# Patient Record
Sex: Female | Born: 1990 | Race: Black or African American | Hispanic: No | Marital: Single | Smoking: Never smoker
Health system: Southern US, Community
[De-identification: ages and names within clinical notes are randomized; demographics above are authoritative.]

## PROBLEM LIST (undated history)

## (undated) ENCOUNTER — Inpatient Hospital Stay (HOSPITAL_COMMUNITY): Payer: Self-pay

## (undated) DIAGNOSIS — J189 Pneumonia, unspecified organism: Secondary | ICD-10-CM

## (undated) DIAGNOSIS — K219 Gastro-esophageal reflux disease without esophagitis: Secondary | ICD-10-CM

## (undated) DIAGNOSIS — O24419 Gestational diabetes mellitus in pregnancy, unspecified control: Secondary | ICD-10-CM

## (undated) HISTORY — PX: NO PAST SURGERIES: SHX2092

---

## 2013-09-18 ENCOUNTER — Encounter (HOSPITAL_COMMUNITY): Payer: Self-pay | Admitting: Emergency Medicine

## 2013-09-18 ENCOUNTER — Emergency Department (HOSPITAL_COMMUNITY)
Admission: EM | Admit: 2013-09-18 | Discharge: 2013-09-18 | Disposition: A | Discharge status: Home / Self Care | Attending: Emergency Medicine | Admitting: Emergency Medicine

## 2013-09-18 DIAGNOSIS — J029 Acute pharyngitis, unspecified: Secondary | ICD-10-CM

## 2013-09-18 LAB — RAPID STREP SCREEN (MED CTR MEBANE ONLY): Streptococcus, Group A Screen (Direct): NEGATIVE

## 2013-09-18 MED ORDER — GUAIFENESIN-CODEINE 100-10 MG/5ML PO SOLN: 5.0000 mL | Freq: Three times a day (TID) | ORAL | Status: DC | PRN

## 2013-09-18 NOTE — ED Notes (Signed)
Pt reported having "upper respiratory tract" symptoms and drainage x 5 days, and now a sore throat starting 2 days ago.

## 2013-09-18 NOTE — ED Notes (Signed)
Pt given d/c instructions and verbalized understanding. NAD at this time.  

## 2013-09-18 NOTE — ED Provider Notes (Signed)
CSN: [id number]     Arrival date & time 09/18/13  0531 History   First MD Initiated Contact with Patient 09/18/13 0544     Chief Complaint  Patient presents with  . Sore Throat   (Consider location/radiation/quality/duration/timing/severity/associated sxs/prior Treatment) Patient is a 22 y.o. female presenting with pharyngitis. The history is provided by the patient.  Sore Throat This is a new problem. Episode onset: 5 days ago. The problem occurs constantly. The problem has been gradually worsening. Pertinent negatives include no chest pain and no shortness of breath. The symptoms are aggravated by swallowing. Nothing relieves the symptoms. She has tried nothing for the symptoms. The treatment provided no relief.    History reviewed. No pertinent past medical history. History reviewed. No pertinent past surgical history. No family history on file. History  Substance Use Topics  . Smoking status: Never Smoker   . Smokeless tobacco: Not on file  . Alcohol Use: Yes     Comment: Occasionaly    OB History   Grav Para Term Preterm Abortions TAB SAB Ect Mult Living                 Review of Systems  Respiratory: Negative for shortness of breath.   Cardiovascular: Negative for chest pain.  All other systems reviewed and are negative.    Allergies  Review of patient's allergies indicates not on file.  Home Medications  No current outpatient prescriptions on file. BP 121/80  Pulse 82  Temp(Src) 97.4 F (36.3 C) (Oral)  Resp 14  SpO2 100%  LMP 01/16/2013 Physical Exam  Nursing note and vitals reviewed. Constitutional: She is oriented to person, place, and time. She appears well-developed and well-nourished. No distress.  HENT:  Head: Normocephalic and atraumatic.  The posterior oropharynx is mildly erythematous with no exudates.  Neck: Normal range of motion. Neck supple.  Cardiovascular: Normal rate and regular rhythm.  Exam reveals no gallop and no friction rub.   No  murmur heard. Pulmonary/Chest: Effort normal and breath sounds normal. No respiratory distress. She has no wheezes.  Abdominal: Soft. Bowel sounds are normal. She exhibits no distension. There is no tenderness.  Musculoskeletal: Normal range of motion.  Lymphadenopathy:    She has no cervical adenopathy.  Neurological: She is alert and oriented to person, place, and time.  Skin: Skin is warm and dry. She is not diaphoretic.    ED Course  Procedures (including critical care time) Labs Review Labs Reviewed  RAPID STREP SCREEN   Imaging Review No results found.  EKG Interpretation   None       MDM  No diagnosis found. Strep test is negative. Symptoms are most likely viral in nature. She will be discharged home with instructions to take ibuprofen as needed and return to emergency department if her symptoms worsen.    Geoffery Lyons, MD 09/18/13 [phone]

## 2013-09-20 LAB — CULTURE, GROUP A STREP

## 2015-07-28 ENCOUNTER — Encounter (HOSPITAL_COMMUNITY): Payer: Self-pay | Admitting: Emergency Medicine

## 2015-07-28 ENCOUNTER — Emergency Department (HOSPITAL_COMMUNITY)
Admission: EM | Admit: 2015-07-28 | Discharge: 2015-07-28 | Disposition: A | Discharge status: Home / Self Care | Payer: 59 | Source: Home / Self Care | Attending: Family Medicine | Admitting: Family Medicine

## 2015-07-28 DIAGNOSIS — R0982 Postnasal drip: Secondary | ICD-10-CM

## 2015-07-28 DIAGNOSIS — J9801 Acute bronchospasm: Secondary | ICD-10-CM

## 2015-07-28 DIAGNOSIS — J189 Pneumonia, unspecified organism: Secondary | ICD-10-CM | POA: Diagnosis not present

## 2015-07-28 MED ORDER — AZITHROMYCIN 250 MG PO TABS: 250.0000 mg | ORAL_TABLET | Freq: Every day | ORAL | Status: DC

## 2015-07-28 MED ORDER — ALBUTEROL SULFATE 108 (90 BASE) MCG/ACT IN AEPB: 1.0000 | INHALATION_SPRAY | RESPIRATORY_TRACT | Status: DC | PRN

## 2015-07-28 MED ORDER — SPACER/AERO-HOLDING CHAMBERS DEVI: Status: DC

## 2015-07-28 MED ORDER — IPRATROPIUM BROMIDE 0.06 % NA SOLN: 2.0000 | Freq: Four times a day (QID) | NASAL | Status: DC

## 2015-07-28 MED ORDER — FLUCONAZOLE 150 MG PO TABS: 150.0000 mg | ORAL_TABLET | Freq: Every day | ORAL | Status: DC

## 2015-07-28 NOTE — ED Provider Notes (Signed)
CSN: [id number]     Arrival date & time 07/28/15  1301 History   First MD Initiated Contact with Patient 07/28/15 1317     Chief Complaint  Patient presents with  . Cough   (Consider location/radiation/quality/duration/timing/severity/associated sxs/prior Treatment) HPI   Cough. Ongoing for 2 mo.  Started after starting at [facility] Initially dry cough.  Comes and goes.  Worse at night Became productive 4 days ago.  Denies fevers, nausea, SOB, palpitations, wheezing.  Coughing episodes typically last several minutes.  Mucinex w/o relief    History reviewed. No pertinent past medical history. History reviewed. No pertinent past surgical history. Family History  Problem Relation Age of Onset  . Hypertension Mother   . Hypertension Father   . Diabetes Brother     type 1   Social History  Substance Use Topics  . Smoking status: Never Smoker   . Smokeless tobacco: None  . Alcohol Use: Yes     Comment: Occasionaly    OB History    No data available     Review of Systems Per HPI with all other pertinent systems negative.   Allergies  Review of patient's allergies indicates no known allergies.  Home Medications   Prior to Admission medications   Medication Sig Start Date End Date Taking? Authorizing Provider  Albuterol Sulfate (PROAIR RESPICLICK) 108 (90 BASE) MCG/ACT AEPB Inhale 1 Inhaler into the lungs every 4 (four) hours as needed (shortness of breath or wheezing). 07/28/15   Ozella Rocks, MD  azithromycin (ZITHROMAX) 250 MG tablet Take 1 tablet (250 mg total) by mouth daily. Take first 2 tablets together, then 1 every day until finished. 07/28/15   Ozella Rocks, MD  DM-Doxylamine-Acetaminophen (NYQUIL COLD & FLU PO) Take 1 capsule by mouth at bedtime.    Historical Provider, MD  fluconazole (DIFLUCAN) 150 MG tablet Take 1 tablet (150 mg total) by mouth daily. Repeat dose in 3 days 07/28/15   Ozella Rocks, MD  guaiFENesin-codeine 100-10 MG/5ML syrup Take 5 mLs by mouth  3 (three) times daily as needed for cough. 09/18/13   Geoffery Lyons, MD  ipratropium (ATROVENT) 0.06 % nasal spray Place 2 sprays into both nostrils 4 (four) times daily. 07/28/15   Ozella Rocks, MD  Pseudoephedrine-APAP-DM (DAYQUIL MULTI-SYMPTOM PO) Take 1 capsule by mouth daily.    Historical Provider, MD  Spacer/Aero-Holding Chambers DEVI Use as directed 07/28/15   Ozella Rocks, MD   Meds Ordered and Administered this Visit  Medications - No data to display  BP 109/68 mmHg  Pulse 73  Temp(Src) 98.7 F (37.1 C) (Oral)  Resp 16  SpO2 100%  LMP 07/12/2015 No data found.   Physical Exam Physical Exam  Constitutional: oriented to person, place, and time. appears well-developed and well-nourished. No distress.  HENT:  Head: Normocephalic and atraumatic.  Eyes: EOMI. PERRL.  Nasal turbinates boggy bilaterally  Neck: Normal range of motion.  Cardiovascular: RRR, no m/r/g, 2+ distal pulses,  Pulmonary/Chest: Effort, left lung and inspiratory wheezing appreciated, no crackles or rhonchi. Abdominal: Soft. Bowel sounds are normal. NonTTP, no distension.  Musculoskeletal: Normal range of motion. Non ttp, no effusion.  Neurological: alert and oriented to person, place, and time.  Skin: Skin is warm. No rash noted. non diaphoretic.  Psychiatric: normal mood and affect. behavior is normal. Judgment and thought content normal.   ED Course  Procedures (including critical care time)  Labs Review Labs Reviewed - No data to display  Imaging Review No  results found.   Visual Acuity Review  Right Eye Distance:   Left Eye Distance:   Bilateral Distance:    Right Eye Near:   Left Eye Near:    Bilateral Near:         MDM   1. Walking pneumonia   2. Bronchospasm   3. Post-nasal drip    Continue Mucinex, start Zyrtec or Allegra at night, nasal Atrovent, and albuterol. Not improving Z-Pak. Use Diflucan if the yeast infection. The need for steroids.    Ozella Rocks,  MD 07/28/15 [phone]

## 2015-07-28 NOTE — Discharge Instructions (Signed)
Your symptoms are likely due to accommodation of postnasal drip causing lung irritation and bronchospasm, possibly a mild case of walking pneumonia. Please use the albuterol every 4 hours for the next 24 hours and every 4 hours as needed for coughing or wheezing. Please start using the nasal Atrovent help dry up any nasal secretions and taken nightly allergy pill such as Zyrtec or Claritin or Allegra. Continue using Mucinex at his full dose. Please start a Z-Pak if you are not improving. Please use the Diflucan if you develop a yeast infection.

## 2015-07-28 NOTE — ED Notes (Signed)
C/o cough States cough is productive States she has seen mucous in her vaginal area and stool Took mucinex as tx

## 2015-09-21 ENCOUNTER — Other Ambulatory Visit (HOSPITAL_COMMUNITY)
Admission: RE | Admit: 2015-09-21 | Discharge: 2015-09-21 | Disposition: A | Discharge status: Home / Self Care | Payer: 59 | Source: Ambulatory Visit | Attending: Family Medicine | Admitting: Family Medicine

## 2015-09-21 ENCOUNTER — Encounter (HOSPITAL_COMMUNITY): Payer: Self-pay | Admitting: Emergency Medicine

## 2015-09-21 ENCOUNTER — Emergency Department (HOSPITAL_COMMUNITY)
Admission: EM | Admit: 2015-09-21 | Discharge: 2015-09-21 | Disposition: A | Discharge status: Home / Self Care | Payer: 59 | Source: Home / Self Care

## 2015-09-21 DIAGNOSIS — N760 Acute vaginitis: Secondary | ICD-10-CM | POA: Diagnosis present

## 2015-09-21 DIAGNOSIS — N898 Other specified noninflammatory disorders of vagina: Secondary | ICD-10-CM | POA: Diagnosis not present

## 2015-09-21 DIAGNOSIS — Z113 Encounter for screening for infections with a predominantly sexual mode of transmission: Secondary | ICD-10-CM | POA: Insufficient documentation

## 2015-09-21 LAB — POCT URINALYSIS DIP (DEVICE)
BILIRUBIN URINE: NEGATIVE
Glucose, UA: NEGATIVE mg/dL
HGB URINE DIPSTICK: NEGATIVE
Ketones, ur: NEGATIVE mg/dL
Nitrite: NEGATIVE
PH: 6.5 (ref 5.0–8.0)
Protein, ur: NEGATIVE mg/dL
SPECIFIC GRAVITY, URINE: 1.025 (ref 1.005–1.030)
Urobilinogen, UA: 0.2 mg/dL (ref 0.0–1.0)

## 2015-09-21 LAB — POCT PREGNANCY, URINE: Preg Test, Ur: NEGATIVE

## 2015-09-21 MED ORDER — CEFTRIAXONE SODIUM 250 MG IJ SOLR
250.0000 mg | Freq: Once | INTRAMUSCULAR | Status: AC
Start: 2015-09-21 — End: 2015-09-21
  Administered 2015-09-21: 250 mg via INTRAMUSCULAR

## 2015-09-21 MED ORDER — AZITHROMYCIN 250 MG PO TABS
ORAL_TABLET | ORAL | Status: AC
  Filled 2015-09-21: qty 4

## 2015-09-21 MED ORDER — AZITHROMYCIN 250 MG PO TABS
1000.0000 mg | ORAL_TABLET | Freq: Once | ORAL | Status: AC
  Administered 2015-09-21: 1000 mg via ORAL

## 2015-09-21 MED ORDER — CEFTRIAXONE SODIUM 250 MG IJ SOLR
INTRAMUSCULAR | Status: AC
  Filled 2015-09-21: qty 250

## 2015-09-21 NOTE — ED Provider Notes (Addendum)
CSN: [id number]     Arrival date & time 09/21/15  1455 History   None    Chief Complaint  Patient presents with  . Vaginal Discharge   (Consider location/radiation/quality/duration/timing/severity/associated sxs/prior Treatment) HPI History obtained from patient:   LOCATION:vagina SEVERITY:0 DURATION:several days CONTEXT: noted thick yellow vaginal discharge over the last few days. Similar symptoms in September was reassured by PCP that all was normal. Afraid something else may be going on. No intercourse since September episode.  QUALITY: similar to previous episode in september MODIFYING FACTORS:using panty liners ASSOCIATED SYMPTOMS: none TIMING: mostly when she wipes her vaginal. Clear drainage noted on panty liner.   History reviewed. No pertinent past medical history. History reviewed. No pertinent past surgical history. Family History  Problem Relation Age of Onset  . Hypertension Mother   . Hypertension Father   . Diabetes Brother     type 1   Social History  Substance Use Topics  . Smoking status: Never Smoker   . Smokeless tobacco: None  . Alcohol Use: Yes     Comment: Occasionaly    OB History    No data available     Review of Systems ROS +'ve vaginal discharge  Denies: HEADACHE, NAUSEA, ABDOMINAL PAIN, CHEST PAIN, CONGESTION, DYSURIA, SHORTNESS OF BREATH, NO CHANGE IN SEXUAL PARTNER  Allergies  Review of patient's allergies indicates no known allergies.  Home Medications   Prior to Admission medications   Medication Sig Start Date End Date Taking? Authorizing Provider  Albuterol Sulfate (PROAIR RESPICLICK) 108 (90 BASE) MCG/ACT AEPB Inhale 1 Inhaler into the lungs every 4 (four) hours as needed (shortness of breath or wheezing). 07/28/15   Ozella Rocksavid J Merrell, MD  azithromycin (ZITHROMAX) 250 MG tablet Take 1 tablet (250 mg total) by mouth daily. Take first 2 tablets together, then 1 every day until finished. 07/28/15   Ozella Rocksavid J Merrell, MD    DM-Doxylamine-Acetaminophen (NYQUIL COLD & FLU PO) Take 1 capsule by mouth at bedtime.    Historical Provider, MD  fluconazole (DIFLUCAN) 150 MG tablet Take 1 tablet (150 mg total) by mouth daily. Repeat dose in 3 days 07/28/15   Ozella Rocksavid J Merrell, MD  guaiFENesin-codeine 100-10 MG/5ML syrup Take 5 mLs by mouth 3 (three) times daily as needed for cough. 09/18/13   Geoffery Lyonsouglas Delo, MD  ipratropium (ATROVENT) 0.06 % nasal spray Place 2 sprays into both nostrils 4 (four) times daily. 07/28/15   Ozella Rocksavid J Merrell, MD  Pseudoephedrine-APAP-DM (DAYQUIL MULTI-SYMPTOM PO) Take 1 capsule by mouth daily.    Historical Provider, MD  Spacer/Aero-Holding Chambers DEVI Use as directed 07/28/15   Ozella Rocksavid J Merrell, MD   Meds Ordered and Administered this Visit  Medications - No data to display  BP 111/81 mmHg  Pulse 81  Temp(Src) 98.3 F (36.8 C) (Oral)  Resp 18  SpO2 98%  LMP 08/29/2015 No data found.   Physical Exam  Constitutional: She is oriented to person, place, and time. She appears well-developed and well-nourished. No distress.  HENT:  Head: Normocephalic and atraumatic.  Eyes: Conjunctivae are normal.  Pulmonary/Chest: Effort normal and breath sounds normal.  Abdominal: Soft. She exhibits no distension. There is no tenderness. There is no rebound.  Genitourinary: Cervix exhibits discharge. Cervix exhibits no motion tenderness. Right adnexum displays no tenderness. Left adnexum displays no tenderness. Vaginal discharge found.    Musculoskeletal: Normal range of motion.  Neurological: She is alert and oriented to person, place, and time.  Skin: Skin is warm and dry.  Psychiatric:  She has a normal mood and affect. Her behavior is normal. Judgment and thought content normal.    ED Course  Procedures (including critical care time)  Labs Review Labs Reviewed  POCT URINALYSIS DIP (DEVICE) - Abnormal; Notable for the following:    Leukocytes, UA TRACE (*)    All other components within normal limits   POCT PREGNANCY, URINE  CERVICOVAGINAL ANCILLARY ONLY    Imaging Review No results found.   Visual Acuity Review  Right Eye Distance:   Left Eye Distance:   Bilateral Distance:    Right Eye Near:   Left Eye Near:    Bilateral Near:         MDM   1. Vaginal discharge    Examination is non specific at this time, no CMT.  As discussed with patient will cover her with ceftriaxone and azithromycin. Cultures should return in 2 days. If positive will call and discuss.  Follow up with her PCP.    Tharon Aquas, PA 09/22/15 1305  Chart is entered to review and document lab results. Attempted to contact patient, voice mail left. No contact with patient, and no change in management at this time.    Tharon Aquas, PA 09/24/15 1310

## 2015-09-21 NOTE — Discharge Instructions (Signed)

## 2015-09-21 NOTE — ED Notes (Signed)
[facility] possible [facility] infection. The patient stated that she is having a discharge and that the symptoms have been off and on since September. She did state that she has seen her PCP for the same.

## 2015-09-23 LAB — CERVICOVAGINAL ANCILLARY ONLY
CHLAMYDIA, DNA PROBE: NEGATIVE
NEISSERIA GONORRHEA: NEGATIVE
Wet Prep (BD Affirm): POSITIVE — AB

## 2015-09-23 NOTE — ED Notes (Signed)
Final report of vaginal swabs negative for GC, chlamydia, positive for trichomonas, not treated. Discussed w Dr Griffin BasilJD Kindl, who authorized Flagyl 500 mg, PO, BID x 7 days, QS, NR. Called patient , and after verifying ID, discussed positive findings. Advised to refrain from unprotected sex w her partner until both parties have completed treatment.  Called and left detailed VM on answering machine at [facility], [address] at patient request

## 2016-03-25 ENCOUNTER — Encounter (HOSPITAL_COMMUNITY): Payer: Self-pay | Admitting: Emergency Medicine

## 2016-03-25 ENCOUNTER — Ambulatory Visit (HOSPITAL_COMMUNITY)
Admission: EM | Admit: 2016-03-25 | Discharge: 2016-03-25 | Disposition: A | Discharge status: Home / Self Care | Payer: 59 | Attending: Family Medicine | Admitting: Family Medicine

## 2016-03-25 DIAGNOSIS — J069 Acute upper respiratory infection, unspecified: Secondary | ICD-10-CM | POA: Diagnosis present

## 2016-03-25 DIAGNOSIS — J302 Other seasonal allergic rhinitis: Secondary | ICD-10-CM | POA: Diagnosis not present

## 2016-03-25 DIAGNOSIS — Z833 Family history of diabetes mellitus: Secondary | ICD-10-CM | POA: Insufficient documentation

## 2016-03-25 DIAGNOSIS — Z8249 Family history of ischemic heart disease and other diseases of the circulatory system: Secondary | ICD-10-CM | POA: Insufficient documentation

## 2016-03-25 LAB — POCT RAPID STREP A: STREPTOCOCCUS, GROUP A SCREEN (DIRECT): NEGATIVE

## 2016-03-25 NOTE — ED Provider Notes (Signed)
CSN: [id number]     Arrival date & time 03/25/16  1508 History   First MD Initiated Contact with Patient 03/25/16 1654     Chief Complaint  Patient presents with  . URI   (Consider location/radiation/quality/duration/timing/severity/associated sxs/prior Treatment) HPI Comments: 25 year old female states that last week she began having what she thought to be cold symptoms. She has had some chills, low-grade fever of 100.4, sore throat. She took some TheraFlu and most of her symptoms abated and she felt better with the exception of sore throat and ears popping. She states that the PND is also persistent.   History reviewed. No pertinent past medical history. History reviewed. No pertinent past surgical history. Family History  Problem Relation Age of Onset  . Hypertension Mother   . Hypertension Father   . Diabetes Brother     type 1   Social History  Substance Use Topics  . Smoking status: Never Smoker   . Smokeless tobacco: None  . Alcohol Use: Yes     Comment: Occasionaly    OB History    No data available     Review of Systems  Constitutional: Positive for fever and activity change. Negative for chills, appetite change and fatigue.  HENT: Positive for congestion, postnasal drip, rhinorrhea and sore throat. Negative for facial swelling.   Eyes: Negative.   Respiratory: Negative.   Cardiovascular: Negative.   Gastrointestinal: Negative.   Musculoskeletal: Negative for neck pain and neck stiffness.  Skin: Negative for pallor and rash.  Neurological: Negative.     Allergies  Review of patient's allergies indicates no known allergies.  Home Medications   Prior to Admission medications   Medication Sig Start Date End Date Taking? Authorizing Provider  Albuterol Sulfate (PROAIR RESPICLICK) 108 (90 BASE) MCG/ACT AEPB Inhale 1 Inhaler into the lungs every 4 (four) hours as needed (shortness of breath or wheezing). 07/28/15   Ozella Rocks, MD  azithromycin (ZITHROMAX) 250  MG tablet Take 1 tablet (250 mg total) by mouth daily. Take first 2 tablets together, then 1 every day until finished. 07/28/15   Ozella Rocks, MD  DM-Doxylamine-Acetaminophen (NYQUIL COLD & FLU PO) Take 1 capsule by mouth at bedtime.    Historical Provider, MD  fluconazole (DIFLUCAN) 150 MG tablet Take 1 tablet (150 mg total) by mouth daily. Repeat dose in 3 days 07/28/15   Ozella Rocks, MD  guaiFENesin-codeine 100-10 MG/5ML syrup Take 5 mLs by mouth 3 (three) times daily as needed for cough. 09/18/13   Geoffery Lyons, MD  ipratropium (ATROVENT) 0.06 % nasal spray Place 2 sprays into both nostrils 4 (four) times daily. 07/28/15   Ozella Rocks, MD  Pseudoephedrine-APAP-DM (DAYQUIL MULTI-SYMPTOM PO) Take 1 capsule by mouth daily.    Historical Provider, MD  Spacer/Aero-Holding Chambers DEVI Use as directed 07/28/15   Ozella Rocks, MD   Meds Ordered and Administered this Visit  Medications - No data to display  BP 131/76 mmHg  Pulse 86  Temp(Src) 99.5 F (37.5 C) (Oral)  Resp 12  SpO2 100%  LMP 03/16/2016 No data found.   Physical Exam  Constitutional: She is oriented to person, place, and time. She appears well-developed and well-nourished. No distress.  HENT:  Mouth/Throat: No oropharyngeal exudate.  Bilateral TMs are retracted. No erythema or effusion. Oropharynx with palatine tonsils. No exudates or swelling. Minimal. Light erythema and positive clear PND.  Eyes: Conjunctivae and EOM are normal.  Neck: Normal range of motion. Neck supple.  Cardiovascular:  Normal rate and regular rhythm.   Pulmonary/Chest: Effort normal and breath sounds normal. No respiratory distress. She has no wheezes.  Musculoskeletal: Normal range of motion.  Lymphadenopathy:    She has no cervical adenopathy.  Neurological: She is alert and oriented to person, place, and time. She exhibits normal muscle tone.  Skin: Skin is warm and dry.  Nursing note and vitals reviewed.   ED Course  Procedures  (including critical care time)  Labs Review Labs Reviewed  POCT RAPID STREP A   Results for orders placed or performed during the hospital encounter of 03/25/16  POCT rapid strep A [facility]  Result Value Ref Range   Streptococcus, Group A Screen (Direct) NEGATIVE NEGATIVE     Imaging Review No results found.   Visual Acuity Review  Right Eye Distance:   Left Eye Distance:   Bilateral Distance:    Right Eye Near:   Left Eye Near:    Bilateral Near:         MDM   1. Other seasonal allergic rhinitis    For nasal and head congestion may take Sudafed PE 10 mg every 4 hours as needed. Saline nasal spray used frequently. For drainage may use Allegra, Claritin or Zyrtec. If you need stronger medicine to stop drainage may take Chlor-Trimeton 2-4 mg every 4 hours. This may cause drowsiness. Ibuprofen 600 mg every 6 hours as needed for pain, discomfort or fever. Drink plenty of fluids and stay well-hydrated.     Hayden Rasmussenavid Rodert Hinch, NP 03/25/16 1742  Hayden Rasmussenavid Nur Rabold, NP 03/25/16 1743  Hayden Rasmussenavid Tayquan Gassman, NP 03/25/16 2029

## 2016-03-25 NOTE — ED Notes (Signed)
C/o cold sx onset 05/03 associated w/ST, fevers, chills, congestion, odynophagia and feeling weak.  A&O x4... No acute distress.

## 2016-03-25 NOTE — Discharge Instructions (Signed)
Allergic Rhinitis For nasal and head congestion may take Sudafed PE 10 mg every 4 hours as needed. Saline nasal spray used frequently. For drainage may use Allegra, Claritin or Zyrtec. If you need stronger medicine to stop drainage may take Chlor-Trimeton 2-4 mg every 4 hours. This may cause drowsiness. Ibuprofen 600 mg every 6 hours as needed for pain, discomfort or fever. Drink plenty of fluids and stay well-hydrated. Cepacol lozenges for throat pain  Allergic rhinitis is when the mucous membranes in the nose respond to allergens. Allergens are particles in the air that cause your body to have an allergic reaction. This causes you to release allergic antibodies. Through a chain of events, these eventually cause you to release histamine into the blood stream. Although meant to protect the body, it is this release of histamine that causes your discomfort, such as frequent sneezing, congestion, and an itchy, runny nose.  CAUSES Seasonal allergic rhinitis (hay fever) is caused by pollen allergens that may come from grasses, trees, and weeds. Year-round allergic rhinitis (perennial allergic rhinitis) is caused by allergens such as house dust mites, pet dander, and mold spores. SYMPTOMS  Nasal stuffiness (congestion).  Itchy, runny nose with sneezing and tearing of the eyes. DIAGNOSIS Your health care provider can help you determine the allergen or allergens that trigger your symptoms. If you and your health care provider are unable to determine the allergen, skin or blood testing may be used. Your health care provider will diagnose your condition after taking your health history and performing a physical exam. Your health care provider may assess you for other related conditions, such as asthma, pink eye, or an ear infection. TREATMENT Allergic rhinitis does not have a cure, but it can be controlled by:  Medicines that block allergy symptoms. These may include allergy shots, nasal sprays, and oral  antihistamines.  Avoiding the allergen. Hay fever may often be treated with antihistamines in pill or nasal spray forms. Antihistamines block the effects of histamine. There are over-the-counter medicines that may help with nasal congestion and swelling around the eyes. Check with your health care provider before taking or giving this medicine. If avoiding the allergen or the medicine prescribed do not work, there are many new medicines your health care provider can prescribe. Stronger medicine may be used if initial measures are ineffective. Desensitizing injections can be used if medicine and avoidance does not work. Desensitization is when a patient is given ongoing shots until the body becomes less sensitive to the allergen. Make sure you follow up with your health care provider if problems continue. HOME CARE INSTRUCTIONS It is not possible to completely avoid allergens, but you can reduce your symptoms by taking steps to limit your exposure to them. It helps to know exactly what you are allergic to so that you can avoid your specific triggers. SEEK MEDICAL CARE IF:  You have a fever.  You develop a cough that does not stop easily (persistent).  You have shortness of breath.  You start wheezing.  Symptoms interfere with normal daily activities.   This information is not intended to replace advice given to you by your health care provider. Make sure you discuss any questions you have with your health care provider.   Document Released: 07/31/2001 Document Revised: 11/26/2014 Document Reviewed: 07/13/2013 Elsevier Interactive Patient Education [employer]2016 [employer] Inc[employer]

## 2016-03-27 ENCOUNTER — Ambulatory Visit (HOSPITAL_COMMUNITY)
Admission: EM | Admit: 2016-03-27 | Discharge: 2016-03-27 | Disposition: A | Discharge status: Home / Self Care | Payer: 59 | Attending: Family Medicine | Admitting: Family Medicine

## 2016-03-27 ENCOUNTER — Encounter (HOSPITAL_COMMUNITY): Payer: Self-pay | Admitting: Emergency Medicine

## 2016-03-27 DIAGNOSIS — Z8249 Family history of ischemic heart disease and other diseases of the circulatory system: Secondary | ICD-10-CM | POA: Diagnosis not present

## 2016-03-27 DIAGNOSIS — R5383 Other fatigue: Secondary | ICD-10-CM | POA: Diagnosis not present

## 2016-03-27 DIAGNOSIS — R5381 Other malaise: Secondary | ICD-10-CM | POA: Insufficient documentation

## 2016-03-27 DIAGNOSIS — Z833 Family history of diabetes mellitus: Secondary | ICD-10-CM | POA: Insufficient documentation

## 2016-03-27 DIAGNOSIS — J029 Acute pharyngitis, unspecified: Secondary | ICD-10-CM | POA: Insufficient documentation

## 2016-03-27 LAB — POCT RAPID STREP A: Streptococcus, Group A Screen (Direct): NEGATIVE

## 2016-03-27 LAB — POCT INFECTIOUS MONO SCREEN: Mono Screen: NEGATIVE

## 2016-03-27 NOTE — Discharge Instructions (Signed)
It is a pleasure to see you today.  The rapid strep and the test for mononucleosis are both negative.   I recommend continued monitoring and symptomatic treatment with Tylenol and ibuprofen as needed; I recommend establishing with a primary care physician for further evaluation and longitudinal follow up.  I am giving you the contact information for a primary care practice nearby.

## 2016-03-27 NOTE — ED Provider Notes (Addendum)
CSN: [id number]     Arrival date & time 03/27/16  1445 History   First MD Initiated Contact with Patient 03/27/16 1624     No chief complaint on file.  (Consider location/radiation/quality/duration/timing/severity/associated sxs/prior Treatment) The history is provided by the patient. No language interpreter was used.  Patient presents with complaint of pharyngitis and malaise; [facility] and had negative rapid strep done then.  Since then she has continued to feel ill.  Her illness began on May 1st with sore throat. Had temp around 100-101F on Weds, May 3rd and has had decreased appetite since. 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   Social Hx; Nonsmoker.  RN.    Family Hx; Brother has had mononucleosis, similar sxs to patient's presenting complaints.   History reviewed. No pertinent past medical history. History reviewed. No pertinent past surgical history. Family History  Problem Relation Age of Onset  . Hypertension Mother   . Hypertension Father   . Diabetes Brother     type 1   Social History  Substance Use Topics  . Smoking status: Never Smoker   . Smokeless tobacco: None  . Alcohol Use: Yes     Comment: Occasionaly    OB History    No data available     Review of Systems  Constitutional: Positive for fever, chills, diaphoresis, appetite change and fatigue.  HENT: Positive for rhinorrhea and sore throat. Negative for ear discharge.   Respiratory: Negative for cough and chest tightness.   Cardiovascular: Negative for chest pain and palpitations.  Gastrointestinal: Negative for nausea, vomiting, abdominal pain and abdominal distention.    Allergies  Review of patient's allergies indicates no known allergies.  Home Medications   Prior to Admission medications   Medication Sig Start Date End Date Taking? Authorizing Provider  Albuterol Sulfate (PROAIR RESPICLICK) 108 (90 BASE) MCG/ACT AEPB Inhale 1  Inhaler into the lungs every 4 (four) hours as needed (shortness of breath or wheezing). 07/28/15   Ozella Rocksavid J Merrell, MD  azithromycin (ZITHROMAX) 250 MG tablet Take 1 tablet (250 mg total) by mouth daily. Take first 2 tablets together, then 1 every day until finished. 07/28/15   Ozella Rocksavid J Merrell, MD  DM-Doxylamine-Acetaminophen (NYQUIL COLD & FLU PO) Take 1 capsule by mouth at bedtime.    Historical Provider, MD  fluconazole (DIFLUCAN) 150 MG tablet Take 1 tablet (150 mg total) by mouth daily. Repeat dose in 3 days 07/28/15   Ozella Rocksavid J Merrell, MD  guaiFENesin-codeine 100-10 MG/5ML syrup Take 5 mLs by mouth 3 (three) times daily as needed for cough. 09/18/13   Geoffery Lyonsouglas Delo, MD  ipratropium (ATROVENT) 0.06 % nasal spray Place 2 sprays into both nostrils 4 (four) times daily. 07/28/15   Ozella Rocksavid J Merrell, MD  Pseudoephedrine-APAP-DM (DAYQUIL MULTI-SYMPTOM PO) Take 1 capsule by mouth daily.    Historical Provider, MD  Spacer/Aero-Holding Chambers DEVI Use as directed 07/28/15   Ozella Rocksavid J Merrell, MD   Meds Ordered and Administered this Visit  Medications - No data to display  BP 117/78 mmHg  Pulse 81  Temp(Src) 98.4 F (36.9 C) (Oral)  Resp 16  SpO2 98%  LMP 03/16/2016 No data found.   Physical Exam  Constitutional: She appears well-developed and well-nourished. No distress.  Mildly ill appearing in no acute distress. No increased work of breathing.   HENT:  Head: Normocephalic and atraumatic.  Right Ear: External ear normal.  Left Ear: External ear normal.  Moist mucus membranes. Oropharynx injected with exudates noted. Symmetric soft palate.   Eyes: Conjunctivae and EOM are normal. Pupils are equal, round, and reactive to light. Right eye exhibits no discharge. Left eye exhibits no discharge.  Neck: Normal range of motion. Neck supple.  Palpable anterior and posterior cervical adenopathy noted.   Cardiovascular: Normal rate, regular rhythm and normal heart sounds.   No murmur heard. Pulmonary/Chest:  Effort normal and breath sounds normal. No respiratory distress. She has no wheezes. She has no rales. She exhibits no tenderness.  Abdominal: Soft. Bowel sounds are normal. She exhibits no distension and no mass. There is no tenderness. There is no rebound and no guarding.  No spleen tip noted.   Skin: She is not diaphoretic.    ED Course  Procedures (including critical care time)  Labs Review Labs Reviewed - No data to display  Imaging Review No results found.   Visual Acuity Review  Right Eye Distance:   Left Eye Distance:   Bilateral Distance:    Right Eye Near:   Left Eye Near:    Bilateral Near:         MDM   1. Pharyngitis   2. Malaise and fatigue    Patient with pharyngitis and malaise, recent negative rapid strep screen.  Presentation suspicious for mononucleosis.  Monospot in [facility] today: negative. Likely viral illness; for continued symptomatic treatment with otc analgesics, rest, fluids. Given contact information for local primary care office for longitudinal evaluation and follow up.     Barbaraann Barthel, MD 03/27/16 1637  Barbaraann Barthel, MD 03/27/16 [phone]

## 2016-03-28 ENCOUNTER — Telehealth (HOSPITAL_COMMUNITY): Payer: Self-pay | Admitting: Family Medicine

## 2016-03-28 NOTE — ED Notes (Signed)
Received note that patient had called re return to work. She may return to work pending improvement, at her discretion.   If she needs a note for work, please ask for intended date of return to work.  Sydney ComptonJames Fard Borunda, MD  Barbaraann BarthelJames O Leslee Suire, MD 03/28/16 [phone]

## 2016-03-29 LAB — CULTURE, GROUP A STREP (THRC)

## 2016-03-30 LAB — CULTURE, GROUP A STREP (THRC)

## 2016-06-04 DIAGNOSIS — Z3201 Encounter for pregnancy test, result positive: Secondary | ICD-10-CM | POA: Diagnosis not present

## 2016-06-22 DIAGNOSIS — Z3400 Encounter for supervision of normal first pregnancy, unspecified trimester: Secondary | ICD-10-CM | POA: Diagnosis not present

## 2016-06-22 LAB — OB RESULTS CONSOLE HIV ANTIBODY (ROUTINE TESTING): HIV: NONREACTIVE

## 2016-06-22 LAB — OB RESULTS CONSOLE RUBELLA ANTIBODY, IGM: Rubella: IMMUNE

## 2016-06-22 LAB — OB RESULTS CONSOLE ANTIBODY SCREEN: ANTIBODY SCREEN: NEGATIVE

## 2016-06-22 LAB — OB RESULTS CONSOLE GC/CHLAMYDIA
Chlamydia: NEGATIVE
Gonorrhea: NEGATIVE

## 2016-06-22 LAB — OB RESULTS CONSOLE ABO/RH: RH TYPE: POSITIVE

## 2016-06-22 LAB — OB RESULTS CONSOLE HEPATITIS B SURFACE ANTIGEN: Hepatitis B Surface Ag: NEGATIVE

## 2016-06-22 LAB — OB RESULTS CONSOLE RPR: RPR: NONREACTIVE

## 2016-07-19 ENCOUNTER — Other Ambulatory Visit (HOSPITAL_COMMUNITY)
Admission: RE | Admit: 2016-07-19 | Discharge: 2016-07-19 | Disposition: A | Discharge status: Home / Self Care | Payer: 59 | Source: Ambulatory Visit | Attending: Obstetrics and Gynecology | Admitting: Obstetrics and Gynecology

## 2016-07-19 ENCOUNTER — Other Ambulatory Visit: Payer: Self-pay | Admitting: Obstetrics and Gynecology

## 2016-07-19 DIAGNOSIS — Z01419 Encounter for gynecological examination (general) (routine) without abnormal findings: Secondary | ICD-10-CM | POA: Insufficient documentation

## 2016-07-19 DIAGNOSIS — Z3401 Encounter for supervision of normal first pregnancy, first trimester: Secondary | ICD-10-CM | POA: Diagnosis not present

## 2016-07-19 DIAGNOSIS — N898 Other specified noninflammatory disorders of vagina: Secondary | ICD-10-CM | POA: Diagnosis not present

## 2016-07-20 LAB — CYTOLOGY - PAP

## 2016-07-30 DIAGNOSIS — O26891 Other specified pregnancy related conditions, first trimester: Secondary | ICD-10-CM | POA: Diagnosis not present

## 2016-07-30 DIAGNOSIS — Z3A12 12 weeks gestation of pregnancy: Secondary | ICD-10-CM | POA: Diagnosis not present

## 2016-08-17 DIAGNOSIS — Z23 Encounter for immunization: Secondary | ICD-10-CM | POA: Diagnosis not present

## 2016-09-09 ENCOUNTER — Encounter (HOSPITAL_COMMUNITY): Payer: Self-pay | Admitting: *Deleted

## 2016-09-09 ENCOUNTER — Inpatient Hospital Stay (HOSPITAL_COMMUNITY)
Admission: AD | Admit: 2016-09-09 | Discharge: 2016-09-09 | Disposition: A | Discharge status: Home / Self Care | Payer: 59 | Source: Ambulatory Visit | Attending: Obstetrics & Gynecology | Admitting: Obstetrics & Gynecology

## 2016-09-09 DIAGNOSIS — Z3A18 18 weeks gestation of pregnancy: Secondary | ICD-10-CM | POA: Insufficient documentation

## 2016-09-09 DIAGNOSIS — R109 Unspecified abdominal pain: Secondary | ICD-10-CM | POA: Diagnosis not present

## 2016-09-09 DIAGNOSIS — O219 Vomiting of pregnancy, unspecified: Secondary | ICD-10-CM | POA: Diagnosis not present

## 2016-09-09 DIAGNOSIS — O26892 Other specified pregnancy related conditions, second trimester: Secondary | ICD-10-CM | POA: Insufficient documentation

## 2016-09-09 DIAGNOSIS — O210 Mild hyperemesis gravidarum: Secondary | ICD-10-CM | POA: Insufficient documentation

## 2016-09-09 LAB — URINALYSIS, ROUTINE W REFLEX MICROSCOPIC
Bilirubin Urine: NEGATIVE
GLUCOSE, UA: 100 mg/dL — AB
Hgb urine dipstick: NEGATIVE
KETONES UR: NEGATIVE mg/dL
Leukocytes, UA: NEGATIVE
Nitrite: NEGATIVE
PROTEIN: NEGATIVE mg/dL
Specific Gravity, Urine: >1.03 — ABNORMAL HIGH (ref 1.005–1.030)
pH: 6 (ref 5.0–8.0)

## 2016-09-09 MED ORDER — METOCLOPRAMIDE HCL 10 MG PO TABS: 10.0000 mg | ORAL_TABLET | Freq: Four times a day (QID) | ORAL | 0 refills | Status: DC | PRN

## 2016-09-09 MED ORDER — IBUPROFEN 600 MG PO TABS
600.0000 mg | ORAL_TABLET | Freq: Once | ORAL | Status: AC
  Administered 2016-09-09: 600 mg via ORAL
  Filled 2016-09-09: qty 1

## 2016-09-09 MED ORDER — METOCLOPRAMIDE HCL 10 MG PO TABS
10.0000 mg | ORAL_TABLET | Freq: Once | ORAL | Status: AC
  Administered 2016-09-09: 10 mg via ORAL
  Filled 2016-09-09: qty 1

## 2016-09-09 NOTE — MAU Note (Signed)
Having some abd pain since Thurs. Thought was gas. Has not really eased off and radiates to lower back. Comes and goes and is sharp and achy at times. Tums helped for about 30 mins and then pain returned. Denies vag bleeding or d/c

## 2016-09-09 NOTE — Discharge Instructions (Signed)
Abdominal Pain During Pregnancy °Abdominal pain is common in pregnancy. Most of the time, it does not cause harm. There are many causes of abdominal pain. Some causes are more serious than others. Some of the causes of abdominal pain in pregnancy are easily diagnosed. Occasionally, the diagnosis takes time to understand. Other times, the cause is not determined. Abdominal pain can be a sign that something is very wrong with the pregnancy, or the pain may have nothing to do with the pregnancy at all. For this reason, always tell your health care provider if you have any abdominal discomfort. °HOME CARE INSTRUCTIONS  °Monitor your abdominal pain for any changes. The following actions may help to alleviate any discomfort you are experiencing: °· Do not have sexual intercourse or put anything in your vagina until your symptoms go away completely. °· Get plenty of rest until your pain improves. °· Drink clear fluids if you feel nauseous. Avoid solid food as long as you are uncomfortable or nauseous. °· Only take over-the-counter or prescription medicine as directed by your health care provider. °· Keep all follow-up appointments with your health care provider. °SEEK IMMEDIATE MEDICAL CARE IF: °· You are bleeding, leaking fluid, or passing tissue from the vagina. °· You have increasing pain or cramping. °· You have persistent vomiting. °· You have painful or bloody urination. °· You have a fever. °· You notice a decrease in your baby's movements. °· You have extreme weakness or feel faint. °· You have shortness of breath, with or without abdominal pain. °· You develop a severe headache with abdominal pain. °· You have abnormal vaginal discharge with abdominal pain. °· You have persistent diarrhea. °· You have abdominal pain that continues even after rest, or gets worse. °MAKE SURE YOU:  °· Understand these instructions. °· Will watch your condition. °· Will get help right away if you are not doing well or get worse. °    °This information is not intended to replace advice given to you by your health care provider. Make sure you discuss any questions you have with your health care provider. °  °Document Released: 11/05/2005 Document Revised: 08/26/2013 Document Reviewed: 06/04/2013 °Elsevier Interactive Patient Education ©2016 [employer] ° °Round Ligament Pain °The round ligament is a cord of muscle and tissue that helps to support the uterus. It can become a source of pain during pregnancy if it becomes stretched or twisted as the baby grows. The pain usually begins in the second trimester of pregnancy, and it can come and go until the baby is delivered. It is not a serious problem, and it does not cause harm to the baby. °Round ligament pain is usually a short, sharp, and pinching pain, but it can also be a dull, lingering, and aching pain. The pain is felt in the lower side of the abdomen or in the groin. It usually starts deep in the groin and moves up to the outside of the hip area. Pain can occur with: °· A sudden change in position. °· Rolling over in bed. °· Coughing or sneezing. °· Physical activity. °HOME CARE INSTRUCTIONS °Watch your condition for any changes. Take these steps to help with your pain: °· When the pain starts, relax. Then try: °¨ Sitting down. °¨ Flexing your knees up to your abdomen. °¨ Lying on your side with one pillow under your abdomen and another pillow between your legs. °¨ Sitting in a warm bath for 15-20 minutes or until the pain goes away. °· Take over-the-counter   and prescription medicines only as told by your health care provider. °· Move slowly when you sit and stand. °· Avoid long walks if they cause pain. °· Stop or lessen your physical activities if they cause pain. °SEEK MEDICAL CARE IF: °· Your pain does not go away with treatment. °· You feel pain in your back that you did not have before. °· Your medicine is not helping. °SEEK IMMEDIATE MEDICAL CARE IF: °· You develop a fever or  chills. °· You develop uterine contractions. °· You develop vaginal bleeding. °· You develop nausea or vomiting. °· You develop diarrhea. °· You have pain when you urinate. °  °This information is not intended to replace advice given to you by your health care provider. Make sure you discuss any questions you have with your health care provider. °  °Document Released: 08/14/2008 Document Revised: 01/28/2012 Document Reviewed: 01/12/2015 °Elsevier Interactive Patient Education ©2016 Elsevier Inc. ° °

## 2016-09-09 NOTE — MAU Provider Note (Signed)
History     CSN: [id number]  Arrival date and time: 09/09/16 [id number]   First Provider Initiated Contact with Patient 09/09/16 [phone]      Chief Complaint  Patient presents with  . Abdominal Pain   HPI   Sydney Dyer is a 25 y.o. female G1P0 here in MAU with abdominal pain. She has had trouble throughout the pregnancy with constipation and N/V. She is currently taking diclegis at the max dose for N/V. She is using miralax and colace for constipation; her last BM was Thursday and it was normal. She initially thought that the symptoms were related to gas, however after her BM on Thursday she continue to experience the abdominal pain. The pain is located on both sides of her lower abdomen and radiates to both sides of her lower back. The pain comes and goes, she feels the pain 3-4 times in one hour. She feels like her abdomen will relax for about 20 mins and then return. She went grocery shopping with her mom and the pain worsened at that time. She feels like the pain worsens when she is up walking around. When she is laying on her back the pain subsides, when she is laying on one of her sides, the pain returns.   She last felt the pain 10 mins ago. When the pain comes it feels like a tight and squeezing cramp. She tried tums and that helped some.  She denies pain now that she is laying flat.  She denies vaginal bleeding, or leaking of fluid. + fetal movement.   OB History    Gravida Para Term Preterm AB Living   1             SAB TAB Ectopic Multiple Live Births                  History reviewed. No pertinent past medical history.  History reviewed. No pertinent surgical history.  Family History  Problem Relation Age of Onset  . Hypertension Mother   . Hypertension Father   . Diabetes Brother     type 1    Social History  Substance Use Topics  . Smoking status: Never Smoker  . Smokeless tobacco: Not on file  . Alcohol use Yes     Comment: Occasionaly     Allergies: No  Known Allergies  Prescriptions Prior to Admission  Medication Sig Dispense Refill Last Dose  . Albuterol Sulfate (PROAIR RESPICLICK) 108 (90 BASE) MCG/ACT AEPB Inhale 1 Inhaler into the lungs every 4 (four) hours as needed (shortness of breath or wheezing). 1 each 0 Unknown at Unknown time  . azithromycin (ZITHROMAX) 250 MG tablet Take 1 tablet (250 mg total) by mouth daily. Take first 2 tablets together, then 1 every day until finished. 6 tablet 0 Unknown at Unknown time  . DM-Doxylamine-Acetaminophen (NYQUIL COLD & FLU PO) Take 1 capsule by mouth at bedtime.   Unknown at Unknown time  . fluconazole (DIFLUCAN) 150 MG tablet Take 1 tablet (150 mg total) by mouth daily. Repeat dose in 3 days 2 tablet 0 Unknown at Unknown time  . guaiFENesin-codeine 100-10 MG/5ML syrup Take 5 mLs by mouth 3 (three) times daily as needed for cough. 120 mL 0 Unknown at Unknown time  . ipratropium (ATROVENT) 0.06 % nasal spray Place 2 sprays into both nostrils 4 (four) times daily. 15 mL 12 Unknown at Unknown time  . Pseudoephedrine-APAP-DM (DAYQUIL MULTI-SYMPTOM PO) Take 1 capsule by mouth daily.   Unknown at  Unknown time  . Spacer/Aero-Holding Rudean Curt Use as directed 1 each 1 Unknown at Unknown time   Results for orders placed or performed during the hospital encounter of 09/09/16 (from the past 48 hour(s))  Urinalysis, Routine w reflex microscopic (not at [facility])     Status: Abnormal   Collection Time: 09/09/16  2:35 AM  Result Value Ref Range   Color, Urine YELLOW YELLOW   APPearance CLEAR CLEAR   Specific Gravity, Urine >1.030 (H) 1.005 - 1.030   pH 6.0 5.0 - 8.0   Glucose, UA 100 (A) NEGATIVE mg/dL   Hgb urine dipstick NEGATIVE NEGATIVE   Bilirubin Urine NEGATIVE NEGATIVE   Ketones, ur NEGATIVE NEGATIVE mg/dL   Protein, ur NEGATIVE NEGATIVE mg/dL   Nitrite NEGATIVE NEGATIVE   Leukocytes, UA NEGATIVE NEGATIVE    Comment: MICROSCOPIC NOT DONE ON URINES WITH NEGATIVE PROTEIN, BLOOD, LEUKOCYTES, NITRITE,  OR GLUCOSE <1000 mg/dL.    Review of Systems  Constitutional: Negative for chills and fever.  Gastrointestinal: Positive for nausea and vomiting (once a week vomiting; constant nausea.).  Genitourinary: Negative for dysuria.   Physical Exam   Blood pressure 117/68, pulse 113, temperature 98.1 F (36.7 C), resp. rate 18, height 5\' 3"  (1.6 m), weight 136 lb (61.7 kg), last menstrual period 03/16/2016.  Physical Exam  Constitutional: She is oriented to person, place, and time. She appears well-developed and well-nourished.  Non-toxic appearance. She does not have a sickly appearance. She does not appear ill. No distress.  GI: Soft. She exhibits no distension and no mass. There is no tenderness. There is no rebound, no guarding and no CVA tenderness.  Genitourinary:  Genitourinary Comments:  Cervix: closed, thick, posterior   Musculoskeletal: Normal range of motion.  Neurological: She is alert and oriented to person, place, and time.  Skin: Skin is warm. She is not diaphoretic.  Psychiatric: Her behavior is normal.    MAU Course  Procedures  None  MDM  + fetal heart tones.  Ibuprofen 600 mg PO & reglan 10 mg given PO.  The patient states she is feeling much better and is ready to go home.  Discussed patient with Dr. Sallye Ober   Assessment and Plan   A:  1. Abdominal pain in pregnancy, second trimester   2. Nausea and vomiting in pregnancy     P:  Discharge home in stable condition Follow up with Dr. Richardson Dopp on 10/23 as scheduled Return to MAU as needed, if symptoms worsen Pregnancy support belt discussed & encouraged Ok to use ibuprofen sparingly as directed on the bottle.    Duane Lope, NP 09/10/2016 9:11 AM

## 2016-09-10 DIAGNOSIS — Z3402 Encounter for supervision of normal first pregnancy, second trimester: Secondary | ICD-10-CM | POA: Diagnosis not present

## 2016-09-10 DIAGNOSIS — Z36 Encounter for antenatal screening for chromosomal anomalies: Secondary | ICD-10-CM | POA: Diagnosis not present

## 2016-11-21 DIAGNOSIS — Z3402 Encounter for supervision of normal first pregnancy, second trimester: Secondary | ICD-10-CM | POA: Diagnosis not present

## 2016-11-21 DIAGNOSIS — Z23 Encounter for immunization: Secondary | ICD-10-CM | POA: Diagnosis not present

## 2016-11-21 DIAGNOSIS — R7302 Impaired glucose tolerance (oral): Secondary | ICD-10-CM | POA: Diagnosis not present

## 2016-11-27 ENCOUNTER — Inpatient Hospital Stay (HOSPITAL_COMMUNITY)
Admission: AD | Admit: 2016-11-27 | Discharge: 2016-11-27 | Disposition: A | Discharge status: Home / Self Care | Payer: 59 | Source: Ambulatory Visit | Attending: Obstetrics and Gynecology | Admitting: Obstetrics and Gynecology

## 2016-11-27 ENCOUNTER — Encounter (HOSPITAL_COMMUNITY): Payer: Self-pay | Admitting: *Deleted

## 2016-11-27 DIAGNOSIS — Z833 Family history of diabetes mellitus: Secondary | ICD-10-CM | POA: Insufficient documentation

## 2016-11-27 DIAGNOSIS — Z8249 Family history of ischemic heart disease and other diseases of the circulatory system: Secondary | ICD-10-CM | POA: Diagnosis not present

## 2016-11-27 DIAGNOSIS — O99613 Diseases of the digestive system complicating pregnancy, third trimester: Secondary | ICD-10-CM | POA: Insufficient documentation

## 2016-11-27 DIAGNOSIS — Z79899 Other long term (current) drug therapy: Secondary | ICD-10-CM | POA: Insufficient documentation

## 2016-11-27 DIAGNOSIS — R112 Nausea with vomiting, unspecified: Secondary | ICD-10-CM | POA: Diagnosis not present

## 2016-11-27 DIAGNOSIS — Z3A29 29 weeks gestation of pregnancy: Secondary | ICD-10-CM | POA: Diagnosis not present

## 2016-11-27 DIAGNOSIS — O219 Vomiting of pregnancy, unspecified: Secondary | ICD-10-CM

## 2016-11-27 HISTORY — DX: Pneumonia, unspecified organism: J18.9

## 2016-11-27 HISTORY — DX: Gestational diabetes mellitus in pregnancy, unspecified control: O24.419

## 2016-11-27 HISTORY — DX: Gastro-esophageal reflux disease without esophagitis: K21.9

## 2016-11-27 LAB — URINALYSIS, ROUTINE W REFLEX MICROSCOPIC
Bilirubin Urine: NEGATIVE
Glucose, UA: NEGATIVE mg/dL
Hgb urine dipstick: NEGATIVE
Ketones, ur: NEGATIVE mg/dL
Leukocytes, UA: NEGATIVE
NITRITE: NEGATIVE
Protein, ur: NEGATIVE mg/dL
SPECIFIC GRAVITY, URINE: 1.013 (ref 1.005–1.030)
pH: 7 (ref 5.0–8.0)

## 2016-11-27 MED ORDER — ONDANSETRON 8 MG PO TBDP: 8.0000 mg | ORAL_TABLET | Freq: Three times a day (TID) | ORAL | 0 refills | Status: DC | PRN

## 2016-11-27 MED ORDER — ONDANSETRON 8 MG PO TBDP
8.0000 mg | ORAL_TABLET | Freq: Once | ORAL | Status: AC
  Administered 2016-11-27: 8 mg via ORAL
  Filled 2016-11-27: qty 1

## 2016-11-27 NOTE — Discharge Instructions (Signed)
Third Trimester of Pregnancy The third trimester is from week 29 through week 40 (months 7 through 9). The third trimester is a time when the unborn baby (fetus) is growing rapidly. At the end of the ninth month, the fetus is about 20 inches in length and weighs 6-10 pounds. Body changes during your third trimester Your body goes through many changes during pregnancy. The changes vary from woman to woman. During the third trimester:  Your weight will continue to increase. You can expect to gain 25-35 pounds (11-16 kg) by the end of the pregnancy.  You may begin to get stretch marks on your hips, abdomen, and breasts.  You may urinate more often because the fetus is moving lower into your pelvis and pressing on your bladder.  You may develop or continue to have heartburn. This is caused by increased hormones that slow down muscles in the digestive tract.  You may develop or continue to have constipation because increased hormones slow digestion and cause the muscles that push waste through your intestines to relax.  You may develop hemorrhoids. These are swollen veins (varicose veins) in the rectum that can itch or be painful.  You may develop swollen, bulging veins (varicose veins) in your legs.  You may have increased body aches in the pelvis, back, or thighs. This is due to weight gain and increased hormones that are relaxing your joints.  You may have changes in your hair. These can include thickening of your hair, rapid growth, and changes in texture. Some women also have hair loss during or after pregnancy, or hair that feels dry or thin. Your hair will most likely return to normal after your baby is born.  Your breasts will continue to grow and they will continue to become tender. A yellow fluid (colostrum) may leak from your breasts. This is the first milk you are producing for your baby.  Your belly button may stick out.  You may notice more swelling in your hands, face, or  ankles.  You may have increased tingling or numbness in your hands, arms, and legs. The skin on your belly may also feel numb.  You may feel short of breath because of your expanding uterus.  You may have more problems sleeping. This can be caused by the size of your belly, increased need to urinate, and an increase in your body's metabolism.  You may notice the fetus "dropping," or moving lower in your abdomen.  You may have increased vaginal discharge.  Your cervix becomes thin and soft (effaced) near your due date. What to expect at prenatal visits You will have prenatal exams every 2 weeks until week 36. Then you will have weekly prenatal exams. During a routine prenatal visit:  You will be weighed to make sure you and the fetus are growing normally.  Your blood pressure will be taken.  Your abdomen will be measured to track your baby's growth.  The fetal heartbeat will be listened to.  Any test results from the previous visit will be discussed.  You may have a cervical check near your due date to see if you have effaced. At around 36 weeks, your health care provider will check your cervix. At the same time, your health care provider will also perform a test on the secretions of the vaginal tissue. This test is to determine if a type of bacteria, Group B streptococcus, is present. Your health care provider will explain this further. Your health care provider may ask you:    What your birth plan is.  How you are feeling.  If you are feeling the baby move.  If you have had any abnormal symptoms, such as leaking fluid, bleeding, severe headaches, or abdominal cramping.  If you are using any tobacco products, including cigarettes, chewing tobacco, and electronic cigarettes.  If you have any questions. Other tests or screenings that may be performed during your third trimester include:  Blood tests that check for low iron levels (anemia).  Fetal testing to check the health,  activity level, and growth of the fetus. Testing is done if you have certain medical conditions or if there are problems during the pregnancy.  Nonstress test (NST). This test checks the health of your baby to make sure there are no signs of problems, such as the baby not getting enough oxygen. During this test, a belt is placed around your belly. The baby is made to move, and its heart rate is monitored during movement. What is false labor? False labor is a condition in which you feel small, irregular tightenings of the muscles in the womb (contractions) that eventually go away. These are called Braxton Hicks contractions. Contractions may last for hours, days, or even weeks before true labor sets in. If contractions come at regular intervals, become more frequent, increase in intensity, or become painful, you should see your health care provider. What are the signs of labor?  Abdominal cramps.  Regular contractions that start at 10 minutes apart and become stronger and more frequent with time.  Contractions that start on the top of the uterus and spread down to the lower abdomen and back.  Increased pelvic pressure and dull back pain.  A watery or bloody mucus discharge that comes from the vagina.  Leaking of amniotic fluid. This is also known as your "water breaking." It could be a slow trickle or a gush. Let your doctor know if it has a color or strange odor. If you have any of these signs, call your health care provider right away, even if it is before your due date. Follow these instructions at home: Eating and drinking  Continue to eat regular, healthy meals.  Do not eat:  Raw meat or meat spreads.  Unpasteurized milk or cheese.  Unpasteurized juice.  Store-made salad.  Refrigerated smoked seafood.  Hot dogs or deli meat, unless they are piping hot.  More than 6 ounces of albacore tuna a week.  Shark, swordfish, king mackerel, or tile fish.  Store-made salads.  Raw  sprouts, such as mung bean or alfalfa sprouts.  Take prenatal vitamins as told by your health care provider.  Take 1000 mg of calcium daily as told by your health care provider.  If you develop constipation:  Take over-the-counter or prescription medicines.  Drink enough fluid to keep your urine clear or pale yellow.  Eat foods that are high in fiber, such as fresh fruits and vegetables, whole grains, and beans.  Limit foods that are high in fat and processed sugars, such as fried and sweet foods. Activity  Exercise only as directed by your health care provider. Healthy pregnant women should aim for 2 hours and 30 minutes of moderate exercise per week. If you experience any pain or discomfort while exercising, stop.  Avoid heavy lifting.  Do not exercise in extreme heat or humidity, or at high altitudes.  Wear low-heel, comfortable shoes.  Practice good posture.  Do not travel far distances unless it is absolutely necessary and only with the approval   of your health care provider.  Wear your seat belt at all times while in a car, on a bus, or on a plane.  Take frequent breaks and rest with your legs elevated if you have leg cramps or low back pain.  Do not use hot tubs, steam rooms, or saunas.  You may continue to have sex unless your health care provider tells you otherwise. Lifestyle  Do not use any products that contain nicotine or tobacco, such as cigarettes and e-cigarettes. If you need help quitting, ask your health care provider.  Do not drink alcohol.  Do not use any medicinal herbs or unprescribed drugs. These chemicals affect the formation and growth of the baby.  If you develop varicose veins:  Wear support pantyhose or compression stockings as told by your healthcare provider.  Elevate your feet for 15 minutes, 3-4 times a day.  Wear a supportive maternity bra to help with breast tenderness. General instructions  Take over-the-counter and prescription  medicines only as told by your health care provider. There are medicines that are either safe or unsafe to take during pregnancy.  Take warm sitz baths to soothe any pain or discomfort caused by hemorrhoids. Use hemorrhoid cream or witch hazel if your health care provider approves.  Avoid cat litter boxes and soil used by cats. These carry germs that can cause birth defects in the baby. If you have a cat, ask someone to clean the litter box for you.  To prepare for the arrival of your baby:  Take prenatal classes to understand, practice, and ask questions about the labor and delivery.  Make a trial run to the hospital.  Visit the hospital and tour the maternity area.  Arrange for maternity or paternity leave through employers.  Arrange for family and friends to take care of pets while you are in the hospital.  Purchase a rear-facing car seat and make sure you know how to install it in your car.  Pack your hospital bag.  Prepare the baby's nursery. Make sure to remove all pillows and stuffed animals from the baby's crib to prevent suffocation.  Visit your dentist if you have not gone during your pregnancy. Use a soft toothbrush to brush your teeth and be gentle when you floss.  Keep all prenatal follow-up visits as told by your health care provider. This is important. Contact a health care provider if:  You are unsure if you are in labor or if your water has broken.  You become dizzy.  You have mild pelvic cramps, pelvic pressure, or nagging pain in your abdominal area.  You have lower back pain.  You have persistent nausea, vomiting, or diarrhea.  You have an unusual or bad smelling vaginal discharge.  You have pain when you urinate. Get help right away if:  You have a fever.  You are leaking fluid from your vagina.  You have spotting or bleeding from your vagina.  You have severe abdominal pain or cramping.  You have rapid weight loss or weight gain.  You have  shortness of breath with chest pain.  You notice sudden or extreme swelling of your face, hands, ankles, feet, or legs.  Your baby makes fewer than 10 movements in 2 hours.  You have severe headaches that do not go away with medicine.  You have vision changes. Summary  The third trimester is from week 29 through week 40, months 7 through 9. The third trimester is a time when the unborn baby (fetus)   is growing rapidly.  During the third trimester, your discomfort may increase as you and your baby continue to gain weight. You may have abdominal, leg, and back pain, sleeping problems, and an increased need to urinate.  During the third trimester your breasts will keep growing and they will continue to become tender. A yellow fluid (colostrum) may leak from your breasts. This is the first milk you are producing for your baby.  False labor is a condition in which you feel small, irregular tightenings of the muscles in the womb (contractions) that eventually go away. These are called Braxton Hicks contractions. Contractions may last for hours, days, or even weeks before true labor sets in.  Signs of labor can include: abdominal cramps; regular contractions that start at 10 minutes apart and become stronger and more frequent with time; watery or bloody mucus discharge that comes from the vagina; increased pelvic pressure and dull back pain; and leaking of amniotic fluid. This information is not intended to replace advice given to you by your health care provider. Make sure you discuss any questions you have with your health care provider. Document Released: 10/30/2001 Document Revised: 04/12/2016 Document Reviewed: 01/06/2013 Elsevier Interactive Patient Education  2017 [employer]  

## 2016-11-27 NOTE — MAU Provider Note (Signed)
History     CSN: [id number]  Arrival date and time: 11/27/16 0102   First Provider Initiated Contact with Patient 11/27/16 [phone]      Chief Complaint  Patient presents with  . Emesis   Sydney Dyer is a 26 y.o. G1P0 at 4475w3d who presents today with nausea and vomiting. She states that she took the GDM test today. She had to repeat it because she vomited after taking the first time. She states that she started vomiting again after this dose. She has vomited about 5 x today. She denies any VB or LOF. She confirms normal fetal movement.    Emesis   This is a new problem. The current episode started today. The problem occurs 5 to 10 times per day. The problem has been unchanged. The emesis has an appearance of stomach contents. There has been no fever. Pertinent negatives include no chills, diarrhea or fever. Risk factors: started after glucose test today  Treatments tried: diclegis  The treatment provided no relief.   Past Medical History:  Diagnosis Date  . Arthritis   . GERD (gastroesophageal reflux disease)   . Gestational diabetes   . Pneumonia     History reviewed. No pertinent surgical history.  Family History  Problem Relation Age of Onset  . Hypertension Mother   . Hypertension Father   . Diabetes Brother     type 1    Social History  Substance Use Topics  . Smoking status: Never Smoker  . Smokeless tobacco: Never Used  . Alcohol use Yes     Comment: not while preg    Allergies: No Known Allergies  Prescriptions Prior to Admission  Medication Sig Dispense Refill Last Dose  . calcium carbonate (TUMS - DOSED IN MG ELEMENTAL CALCIUM) 500 MG chewable tablet Chew 1 tablet by mouth daily.   Past Week at Unknown time  . Doxylamine-Pyridoxine (DICLEGIS PO) Take by mouth.   11/26/2016 at Unknown time  . prenatal vitamin w/FE, FA (PRENATAL 1 + 1) 27-1 MG TABS tablet Take 1 tablet by mouth daily at 12 noon.   11/26/2016 at Unknown time  . Albuterol Sulfate (PROAIR RESPICLICK)  108 (90 BASE) MCG/ACT AEPB Inhale 1 Inhaler into the lungs every 4 (four) hours as needed (shortness of breath or wheezing). 1 each 0 Unknown at Unknown time  . ipratropium (ATROVENT) 0.06 % nasal spray Place 2 sprays into both nostrils 4 (four) times daily. 15 mL 12 Unknown at Unknown time  . metoCLOPramide (REGLAN) 10 MG tablet Take 1 tablet (10 mg total) by mouth every 6 (six) hours as needed for nausea. 30 tablet 0   . Spacer/Aero-Holding Rudean Curthambers DEVI Use as directed 1 each 1 Unknown at Unknown time    Review of Systems  Constitutional: Negative for chills and fever.  Gastrointestinal: Positive for nausea and vomiting. Negative for diarrhea.  Genitourinary: Negative for vaginal bleeding and vaginal discharge.   Physical Exam   Blood pressure 121/60, pulse 91, temperature 98.3 F (36.8 C), temperature source Oral, resp. rate 18, height 5' 2.5" (1.588 m), weight 150 lb (68 kg), last menstrual period 03/16/2016.  Physical Exam  Vitals reviewed. Constitutional: She is oriented to person, place, and time. She appears well-developed and well-nourished. No distress.  HENT:  Head: Normocephalic.  Cardiovascular: Normal rate.   Respiratory: Effort normal.  GI: Soft. There is no tenderness. There is no rebound.  Neurological: She is alert and oriented to person, place, and time.  Skin: Skin is warm and  dry.  Psychiatric: She has a normal mood and affect.   Results for orders placed or performed during the hospital encounter of 11/27/16 (from the past 24 hour(s))  Urinalysis, Routine w reflex microscopic     Status: Abnormal   Collection Time: 11/27/16  1:19 AM  Result Value Ref Range   Color, Urine STRAW (A) YELLOW   APPearance CLEAR CLEAR   Specific Gravity, Urine 1.013 1.005 - 1.030   pH 7.0 5.0 - 8.0   Glucose, UA NEGATIVE NEGATIVE mg/dL   Hgb urine dipstick NEGATIVE NEGATIVE   Bilirubin Urine NEGATIVE NEGATIVE   Ketones, ur NEGATIVE NEGATIVE mg/dL   Protein, ur NEGATIVE  NEGATIVE mg/dL   Nitrite NEGATIVE NEGATIVE   Leukocytes, UA NEGATIVE NEGATIVE    FHT: 135, moderate with 15x15 accels, no decels Toco: about 4 contractions for the entire time on monitor.  MAU Course  Procedures  MDM Patient has had zofran. She is tolerating PO  0320: D/W Dr. Charlotta Newton, ok for DC home. Will send with zofran rx to take at home.   Assessment and Plan   1. Non-intractable vomiting with nausea, unspecified vomiting type   2. [redacted] weeks gestation of pregnancy    DC home Comfort measures reviewed  3rd Trimester precautions  PTL precautions  Fetal kick counts RX: zofran PRN #[id number]  Return to MAU as needed FU with OB as planned  Follow-up Information    COLE,TARA J., MD Follow up.   Specialty:  Obstetrics and Gynecology Contact information: [address] [place] [address] [phone]            Tawnya Crook 11/27/2016, 1:54 AM

## 2016-11-27 NOTE — MAU Note (Signed)
PT  SAYS  SHE WENT  TO OFFICE  TODAY FOR GLUCOSE TEST - VOMITED   AND HAS BEEN VOMITING  EVER SINCE.  SAYS BABY MOVING  LESS-  IN TRIAGE -  [facility]- 147

## 2016-12-03 ENCOUNTER — Encounter: Payer: 59 | Attending: Obstetrics and Gynecology | Admitting: Skilled Nursing Facility1

## 2016-12-03 DIAGNOSIS — O99810 Abnormal glucose complicating pregnancy: Secondary | ICD-10-CM | POA: Diagnosis not present

## 2016-12-03 DIAGNOSIS — Z713 Dietary counseling and surveillance: Secondary | ICD-10-CM | POA: Insufficient documentation

## 2016-12-04 ENCOUNTER — Encounter: Payer: Self-pay | Admitting: Skilled Nursing Facility1

## 2016-12-04 NOTE — Progress Notes (Signed)
  Patient was seen on 12/03/2016 for Gestational Diabetes self-management class at the [facility]. The following learning objectives were met by the patient during this course:   States the definition of Gestational Diabetes  States why dietary management is important in controlling blood glucose  Describes the effects each nutrient has on blood glucose levels  Demonstrates ability to create a balanced meal plan  Demonstrates carbohydrate counting   States when to check blood glucose levels involving a total of 4 separate occurences in a day  Demonstrates proper blood glucose monitoring techniques  States the effect of stress and exercise on blood glucose levels  States the importance of limiting caffeine and abstaining from alcohol and smoking  Demonstrates the knowledge the glucometer provided in class may not be covered by their insurance and to call their insurance provider immediately after class to know which glucometer their insurance provider does cover as well as calling their physician the next day for a prescription to the glucometer their insurance does cover (if the one provided is not) as well as the lancets and strips for that meter.  Blood glucose monitor given: contour next Lot # [id number] Exp: 2017-08-18 Blood glucose reading: 104  Patient instructed to monitor glucose levels: FBS: 60 - <90 1 hour: <140 2 hour: <120  *Patient received handouts:  Nutrition Diabetes and Pregnancy  Carbohydrate Counting List  Patient will be seen for follow-up as needed.

## 2016-12-25 ENCOUNTER — Encounter (HOSPITAL_COMMUNITY): Payer: Self-pay

## 2016-12-25 ENCOUNTER — Inpatient Hospital Stay (HOSPITAL_COMMUNITY)
Admission: AD | Admit: 2016-12-25 | Discharge: 2016-12-26 | Disposition: A | Discharge status: Home / Self Care | Payer: 59 | Source: Ambulatory Visit | Attending: Obstetrics and Gynecology | Admitting: Obstetrics and Gynecology

## 2016-12-25 ENCOUNTER — Inpatient Hospital Stay (HOSPITAL_COMMUNITY): Payer: 59

## 2016-12-25 DIAGNOSIS — O26893 Other specified pregnancy related conditions, third trimester: Secondary | ICD-10-CM | POA: Insufficient documentation

## 2016-12-25 DIAGNOSIS — N760 Acute vaginitis: Secondary | ICD-10-CM | POA: Insufficient documentation

## 2016-12-25 DIAGNOSIS — O288 Other abnormal findings on antenatal screening of mother: Secondary | ICD-10-CM | POA: Insufficient documentation

## 2016-12-25 DIAGNOSIS — Z3A33 33 weeks gestation of pregnancy: Secondary | ICD-10-CM | POA: Diagnosis not present

## 2016-12-25 DIAGNOSIS — O4703 False labor before 37 completed weeks of gestation, third trimester: Secondary | ICD-10-CM | POA: Insufficient documentation

## 2016-12-25 DIAGNOSIS — B9689 Other specified bacterial agents as the cause of diseases classified elsewhere: Secondary | ICD-10-CM | POA: Insufficient documentation

## 2016-12-25 LAB — URINALYSIS, ROUTINE W REFLEX MICROSCOPIC
BILIRUBIN URINE: NEGATIVE
Glucose, UA: NEGATIVE mg/dL
Hgb urine dipstick: NEGATIVE
Ketones, ur: NEGATIVE mg/dL
LEUKOCYTES UA: NEGATIVE
NITRITE: NEGATIVE
PH: 6 (ref 5.0–8.0)
Protein, ur: NEGATIVE mg/dL
SPECIFIC GRAVITY, URINE: 1.018 (ref 1.005–1.030)

## 2016-12-25 LAB — FETAL FIBRONECTIN: Fetal Fibronectin: NEGATIVE

## 2016-12-25 LAB — WET PREP, GENITAL
SPERM: NONE SEEN
TRICH WET PREP: NONE SEEN
YEAST WET PREP: NONE SEEN

## 2016-12-25 MED ORDER — NIFEDIPINE 10 MG PO CAPS
10.0000 mg | ORAL_CAPSULE | ORAL | Status: DC | PRN
  Administered 2016-12-25 (×3): 10 mg via ORAL
  Filled 2016-12-25: qty 1

## 2016-12-25 MED ORDER — LACTATED RINGERS IV BOLUS (SEPSIS)
1000.0000 mL | Freq: Once | INTRAVENOUS | Status: AC
  Administered 2016-12-25: 1000 mL via INTRAVENOUS

## 2016-12-25 MED ORDER — LACTATED RINGERS IV SOLN
INTRAVENOUS | Status: DC
  Administered 2016-12-25: 23:00:00 via INTRAVENOUS

## 2016-12-25 NOTE — MAU Note (Signed)
Patient presents with c/o constant back pain that started today. Patient is has also experienced 3 ctx today. Patient denies any bleeding or LOF. Fetus active. Patient is also having some nausea.

## 2016-12-25 NOTE — Progress Notes (Signed)
Urine sent to lab 

## 2016-12-26 DIAGNOSIS — Z3A33 33 weeks gestation of pregnancy: Secondary | ICD-10-CM

## 2016-12-26 DIAGNOSIS — O4703 False labor before 37 completed weeks of gestation, third trimester: Secondary | ICD-10-CM | POA: Diagnosis not present

## 2016-12-26 DIAGNOSIS — N760 Acute vaginitis: Secondary | ICD-10-CM | POA: Diagnosis not present

## 2016-12-26 DIAGNOSIS — O26893 Other specified pregnancy related conditions, third trimester: Secondary | ICD-10-CM | POA: Diagnosis not present

## 2016-12-26 DIAGNOSIS — B9689 Other specified bacterial agents as the cause of diseases classified elsewhere: Secondary | ICD-10-CM | POA: Diagnosis not present

## 2016-12-26 DIAGNOSIS — O288 Other abnormal findings on antenatal screening of mother: Secondary | ICD-10-CM | POA: Diagnosis not present

## 2016-12-26 LAB — GC/CHLAMYDIA PROBE AMP (~~LOC~~) NOT AT ARMC
CHLAMYDIA, DNA PROBE: NEGATIVE
Neisseria Gonorrhea: NEGATIVE

## 2016-12-26 MED ORDER — ACETAMINOPHEN 500 MG PO TABS
1000.0000 mg | ORAL_TABLET | Freq: Once | ORAL | Status: AC
  Administered 2016-12-26: 1000 mg via ORAL
  Filled 2016-12-26: qty 2

## 2016-12-26 MED ORDER — PROMETHAZINE HCL 25 MG/ML IJ SOLN
25.0000 mg | Freq: Once | INTRAMUSCULAR | Status: AC
  Administered 2016-12-26: 25 mg via INTRAVENOUS
  Filled 2016-12-26: qty 1

## 2016-12-26 MED ORDER — PROMETHAZINE HCL 25 MG PO TABS: 25.0000 mg | ORAL_TABLET | Freq: Four times a day (QID) | ORAL | 1 refills | Status: AC | PRN

## 2016-12-26 MED ORDER — METRONIDAZOLE 500 MG PO TABS: 500.0000 mg | ORAL_TABLET | Freq: Two times a day (BID) | ORAL | 0 refills | Status: DC

## 2016-12-26 NOTE — Discharge Instructions (Signed)
Braxton Hicks Contractions Contractions of the uterus can occur throughout pregnancy. Contractions are not always a sign that you are in labor.  WHAT ARE BRAXTON HICKS CONTRACTIONS?  Contractions that occur before labor are called Braxton Hicks contractions, or false labor. Toward the end of pregnancy (32-34 weeks), these contractions can develop more often and may become more forceful. This is not true labor because these contractions do not result in opening (dilatation) and thinning of the cervix. They are sometimes difficult to tell apart from true labor because these contractions can be forceful and people have different pain tolerances. You should not feel embarrassed if you go to the hospital with false labor. Sometimes, the only way to tell if you are in true labor is for your health care provider to look for changes in the cervix. If there are no prenatal problems or other health problems associated with the pregnancy, it is completely safe to be sent home with false labor and await the onset of true labor. HOW CAN YOU TELL THE DIFFERENCE BETWEEN TRUE AND FALSE LABOR? False Labor   The contractions of false labor are usually shorter and not as hard as those of true labor.   The contractions are usually irregular.   The contractions are often felt in the front of the lower abdomen and in the groin.   The contractions may go away when you walk around or change positions while lying down.   The contractions get weaker and are shorter lasting as time goes on.   The contractions do not usually become progressively stronger, regular, and closer together as with true labor.  True Labor   Contractions in true labor last 30-70 seconds, become very regular, usually become more intense, and increase in frequency.   The contractions do not go away with walking.   The discomfort is usually felt in the top of the uterus and spreads to the lower abdomen and low back.   True labor  can be determined by your health care provider with an exam. This will show that the cervix is dilating and getting thinner.  WHAT TO REMEMBER  Keep up with your usual exercises and follow other instructions given by your health care provider.   Take medicines as directed by your health care provider.   Keep your regular prenatal appointments.   Eat and drink lightly if you think you are going into labor.   If Braxton Hicks contractions are making you uncomfortable:   Change your position from lying down or resting to walking, or from walking to resting.   Sit and rest in a tub of warm water.   Drink 2-3 glasses of water. Dehydration may cause these contractions.   Do slow and deep breathing several times an hour.  WHEN SHOULD I SEEK IMMEDIATE MEDICAL CARE? Seek immediate medical care if:  Your contractions become stronger, more regular, and closer together.   You have fluid leaking or gushing from your vagina.   You have a fever.   You pass blood-tinged mucus.   You have vaginal bleeding.   You have continuous abdominal pain.   You have low back pain that you never had before.   You feel your baby's head pushing down and causing pelvic pressure.   Your baby is not moving as much as it used to.  This information is not intended to replace advice given to you by your health care provider. Make sure you discuss any questions you have with your health  care provider. Document Released: 11/05/2005 Document Revised: 02/27/2016 Document Reviewed: 08/17/2013 Elsevier Interactive Patient Education  2017 [employer]   Bacterial Vaginosis Bacterial vaginosis is a vaginal infection that occurs when the normal balance of bacteria in the vagina is disrupted. It results from an overgrowth of certain bacteria. This is the most common vaginal infection among women ages 2815-44. Because bacterial vaginosis increases your risk for STIs (sexually transmitted  infections), getting treated can help reduce your risk for chlamydia, gonorrhea, herpes, and HIV (human immunodeficiency virus). Treatment is also important for preventing complications in pregnant women, because this condition can cause an early (premature) delivery. What are the causes? This condition is caused by an increase in harmful bacteria that are normally present in small amounts in the vagina. However, the reason that the condition develops is not fully understood. What increases the risk? The following factors may make you more likely to develop this condition:  Having a new sexual partner or multiple sexual partners.  Having unprotected sex.  Douching.  Having an intrauterine device (IUD).  Smoking.  Drug and alcohol abuse.  Taking certain antibiotic medicines.  Being pregnant. You cannot get bacterial vaginosis from toilet seats, bedding, swimming pools, or contact with objects around you. What are the signs or symptoms? Symptoms of this condition include:  Grey or white vaginal discharge. The discharge can also be watery or foamy.  A fish-like odor with discharge, especially after sexual intercourse or during menstruation.  Itching in and around the vagina.  Burning or pain with urination. Some women with bacterial vaginosis have no signs or symptoms. How is this diagnosed? This condition is diagnosed based on:  Your medical history.  A physical exam of the vagina.  Testing a sample of vaginal fluid under a microscope to look for a large amount of bad bacteria or abnormal cells. Your health care provider may use a cotton swab or a small wooden spatula to collect the sample. How is this treated? This condition is treated with antibiotics. These may be given as a pill, a vaginal cream, or a medicine that is put into the vagina (suppository). If the condition comes back after treatment, a second round of antibiotics may be needed. Follow these instructions at  home: Medicines  Take over-the-counter and prescription medicines only as told by your health care provider.  Take or use your antibiotic as told by your health care provider. Do not stop taking or using the antibiotic even if you start to feel better. General instructions  If you have a female sexual partner, tell her that you have a vaginal infection. She should see her health care provider and be treated if she has symptoms. If you have a female sexual partner, he does not need treatment.  During treatment:  Avoid sexual activity until you finish treatment.  Do not douche.  Avoid alcohol as directed by your health care provider.  Avoid breastfeeding as directed by your health care provider.  Drink enough water and fluids to keep your urine clear or pale yellow.  Keep the area around your vagina and rectum clean.  Wash the area daily with warm water.  Wipe yourself from front to back after using the toilet.  Keep all follow-up visits as told by your health care provider. This is important. How is this prevented?  Do not douche.  Wash the outside of your vagina with warm water only.  Use protection when having sex. This includes latex condoms and dental dams.  Limit  many sexual partners you have. To help prevent bacterial vaginosis, it is best to have sex with just one partner (monogamous). °· Make sure you and your sexual partner are tested for STIs. °· Wear cotton or cotton-lined underwear. °· Avoid wearing tight pants and pantyhose, especially during summer. °· Limit the amount of alcohol that you drink. °· Do not use any products that contain nicotine or tobacco, such as cigarettes and e-cigarettes. If you need help quitting, ask your health care provider. °· Do not use illegal drugs. °Where to find more information: °· Centers for Disease Control and Prevention: www.cdc.gov/std °· American Sexual Health Association (ASHA): www.ashastd.org °· U.[place] Department of Health and  Human Services, Office on Women's Health: www.womenshealth.gov/ or https://www.womenshealth.gov/a-z-topics/bacterial-vaginosis °Contact a health care provider if: °· Your symptoms do not improve, even after treatment. °· You have more discharge or pain when urinating. °· You have a fever. °· You have pain in your abdomen. °· You have pain during sex. °· You have vaginal bleeding between periods. °Summary °· Bacterial vaginosis is a vaginal infection that occurs when the normal balance of bacteria in the vagina is disrupted. °· Because bacterial vaginosis increases your risk for STIs (sexually transmitted infections), getting treated can help reduce your risk for chlamydia, gonorrhea, herpes, and HIV (human immunodeficiency virus). Treatment is also important for preventing complications in pregnant women, because the condition can cause an early (premature) delivery. °· This condition is treated with antibiotic medicines. These may be given as a pill, a vaginal cream, or a medicine that is put into the vagina (suppository). °This information is not intended to replace advice given to you by your health care provider. Make sure you discuss any questions you have with your health care provider. °Document Released: 11/05/2005 Document Revised: 07/21/2016 Document Reviewed: 07/21/2016 °Elsevier Interactive Patient Education © 2017 [employer]. ° °

## 2016-12-27 ENCOUNTER — Encounter: Payer: Self-pay | Admitting: Advanced Practice Midwife

## 2016-12-27 DIAGNOSIS — O321XX0 Maternal care for breech presentation, not applicable or unspecified: Secondary | ICD-10-CM | POA: Insufficient documentation

## 2016-12-28 ENCOUNTER — Encounter (HOSPITAL_COMMUNITY): Payer: Self-pay

## 2016-12-28 ENCOUNTER — Inpatient Hospital Stay (HOSPITAL_COMMUNITY)
Admission: AD | Admit: 2016-12-28 | Discharge: 2016-12-28 | Disposition: A | Discharge status: Home / Self Care | Payer: 59 | Source: Ambulatory Visit | Attending: Obstetrics and Gynecology | Admitting: Obstetrics and Gynecology

## 2016-12-28 DIAGNOSIS — K219 Gastro-esophageal reflux disease without esophagitis: Secondary | ICD-10-CM | POA: Insufficient documentation

## 2016-12-28 DIAGNOSIS — Z3A33 33 weeks gestation of pregnancy: Secondary | ICD-10-CM

## 2016-12-28 DIAGNOSIS — O24410 Gestational diabetes mellitus in pregnancy, diet controlled: Secondary | ICD-10-CM | POA: Insufficient documentation

## 2016-12-28 DIAGNOSIS — Z833 Family history of diabetes mellitus: Secondary | ICD-10-CM | POA: Insufficient documentation

## 2016-12-28 DIAGNOSIS — O26893 Other specified pregnancy related conditions, third trimester: Secondary | ICD-10-CM | POA: Diagnosis not present

## 2016-12-28 DIAGNOSIS — O321XX0 Maternal care for breech presentation, not applicable or unspecified: Secondary | ICD-10-CM | POA: Diagnosis not present

## 2016-12-28 DIAGNOSIS — Z8249 Family history of ischemic heart disease and other diseases of the circulatory system: Secondary | ICD-10-CM | POA: Insufficient documentation

## 2016-12-28 DIAGNOSIS — Z79899 Other long term (current) drug therapy: Secondary | ICD-10-CM | POA: Insufficient documentation

## 2016-12-28 DIAGNOSIS — Z3689 Encounter for other specified antenatal screening: Secondary | ICD-10-CM

## 2016-12-28 DIAGNOSIS — R102 Pelvic and perineal pain: Secondary | ICD-10-CM | POA: Diagnosis not present

## 2016-12-28 DIAGNOSIS — O99613 Diseases of the digestive system complicating pregnancy, third trimester: Secondary | ICD-10-CM | POA: Diagnosis not present

## 2016-12-28 LAB — URINALYSIS, ROUTINE W REFLEX MICROSCOPIC
BILIRUBIN URINE: NEGATIVE
Glucose, UA: 50 mg/dL — AB
Hgb urine dipstick: NEGATIVE
KETONES UR: NEGATIVE mg/dL
LEUKOCYTES UA: NEGATIVE
NITRITE: NEGATIVE
PH: 7 (ref 5.0–8.0)
PROTEIN: NEGATIVE mg/dL
Specific Gravity, Urine: 1.012 (ref 1.005–1.030)

## 2016-12-28 NOTE — MAU Note (Signed)
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

## 2016-12-28 NOTE — MAU Provider Note (Signed)
History     CSN: [id number]  Arrival date and time: 12/28/16 4098   First Provider Initiated Contact with Patient 12/28/16 [phone]      Chief Complaint  Patient presents with  . Pelvic Pain  . Contractions   G1 @33 .6 weeks here with pelvic pain. She reports pain was mild and intermittent last night as she was working then became more intense after to arrived home this am. She describes as crampy, achey and lasting about one minute. She has not tried anything for the pain. She is not having the pain currently. She denies VB and LOF. She reports good FM. Pregnancy has been complicated by A1GDM and persistent breech position. She was seen 3 days ago in MAU for ctx and being treated for BV.     OB History    Gravida Para Term Preterm AB Living   1             SAB TAB Ectopic Multiple Live Births                  Past Medical History:  Diagnosis Date  . Arthritis   . GERD (gastroesophageal reflux disease)   . Gestational diabetes   . Pneumonia     History reviewed. No pertinent surgical history.  Family History  Problem Relation Age of Onset  . Hypertension Mother   . Hypertension Father   . Diabetes Brother     type 1    Social History  Substance Use Topics  . Smoking status: Never Smoker  . Smokeless tobacco: Never Used  . Alcohol use Yes     Comment: not while preg    Allergies: No Known Allergies  Prescriptions Prior to Admission  Medication Sig Dispense Refill Last Dose  . calcium carbonate (TUMS - DOSED IN MG ELEMENTAL CALCIUM) 500 MG chewable tablet Chew 1 tablet by mouth daily as needed for heartburn.    12/25/2016 at Unknown time  . Doxylamine-Pyridoxine (DICLEGIS PO) Take 1-2 tablets by mouth 3 (three) times daily. 1 in the morning and afternoon and 2 at bedtime   12/25/2016 at Unknown time  . metroNIDAZOLE (FLAGYL) 500 MG tablet Take 1 tablet (500 mg total) by mouth 2 (two) times daily. 14 tablet 0   . ondansetron (ZOFRAN ODT) 8 MG disintegrating tablet Take  1 tablet (8 mg total) by mouth every 8 (eight) hours as needed for nausea or vomiting. 20 tablet 0 12/25/2016 at Unknown time  . prenatal vitamin w/FE, FA (PRENATAL 1 + 1) 27-1 MG TABS tablet Take 1 tablet by mouth daily at 12 noon.   Past Week at Unknown time  . promethazine (PHENERGAN) 25 MG tablet Take 1 tablet (25 mg total) by mouth every 6 (six) hours as needed. 30 tablet 1     Review of Systems  Constitutional: Negative for fever.  Gastrointestinal: Positive for constipation.  Genitourinary: Positive for pelvic pain. Negative for vaginal bleeding and vaginal discharge.   Physical Exam   Blood pressure 123/74, pulse 108, temperature 98.3 F (36.8 C), resp. rate 18, height 5' 2.5" (1.588 m), weight 71.3 kg (157 lb 1.3 oz), last menstrual period 03/16/2016.  Physical Exam  Nursing note and vitals reviewed. Constitutional: She is oriented to person, place, and time. She appears well-developed and well-nourished. No distress (appears comfortable).  HENT:  Head: Normocephalic and atraumatic.  Neck: Normal range of motion.  Respiratory: Effort normal.  GI: Soft. She exhibits no distension. There is no tenderness.  gravid  Genitourinary:  Genitourinary Comments: SVE: closed/thick  Musculoskeletal: Normal range of motion.  Neurological: She is alert and oriented to person, place, and time.  Skin: Skin is warm and dry.  Psychiatric: She has a normal mood and affect.   EFM: 145 bpm, mod variability, + accels, no decels Toco: irritability  Results for orders placed or performed during the hospital encounter of 12/28/16 (from the past 24 hour(s))  Urinalysis, Routine w reflex microscopic     Status: Abnormal   Collection Time: 12/28/16  9:00 AM  Result Value Ref Range   Color, Urine YELLOW YELLOW   APPearance HAZY (A) CLEAR   Specific Gravity, Urine 1.012 1.005 - 1.030   pH 7.0 5.0 - 8.0   Glucose, UA 50 (A) NEGATIVE mg/dL   Hgb urine dipstick NEGATIVE NEGATIVE   Bilirubin Urine  NEGATIVE NEGATIVE   Ketones, ur NEGATIVE NEGATIVE mg/dL   Protein, ur NEGATIVE NEGATIVE mg/dL   Nitrite NEGATIVE NEGATIVE   Leukocytes, UA NEGATIVE NEGATIVE   Limited bedside US: complete breech  MAU Course  Procedures Po fluids  MDM Labs ordered and reviewed. No evidence of UTI or PTL. Pain may be caused by fetal position/movement or progressing pregnancy. Presentation, clinical findings, and plan discussed with Dr. Dion BodyVarnado. Stable for discharge home.  Assessment and Plan   1. [redacted] weeks gestation of pregnancy   2. NST (non-stress test) reactive   3. Pelvic pain affecting pregnancy in third trimester, antepartum   4. Breech presentation, single or unspecified fetus   5.      A1GDM  Discharge home Rest Hydrate OOW until Monday PTL precautions Follow up in office as scheduled next week  Allergies as of 12/28/2016   No Known Allergies     Medication List    STOP taking these medications   ondansetron 8 MG disintegrating tablet Commonly known as:  ZOFRAN ODT     TAKE these medications   calcium carbonate 500 MG chewable tablet Commonly known as:  TUMS - dosed in mg elemental calcium Chew 1 tablet by mouth daily as needed for heartburn.   DICLEGIS PO Take 1-2 tablets by mouth 3 (three) times daily. 1 in the morning and afternoon and 2 at bedtime   metroNIDAZOLE 500 MG tablet Commonly known as:  FLAGYL Take 1 tablet (500 mg total) by mouth 2 (two) times daily.   prenatal vitamin w/FE, FA 27-1 MG Tabs tablet Take 1 tablet by mouth daily at 12 noon.   promethazine 25 MG tablet Commonly known as:  PHENERGAN Take 1 tablet (25 mg total) by mouth every 6 (six) hours as needed.      Donette LarryMelanie Laron Angelini, CNM 12/28/2016, 9:25 AM

## 2016-12-28 NOTE — Discharge Instructions (Signed)
Introduction Patient Name: ________________________________________________ Patient Due Date: ____________________ What is a fetal movement count? A fetal movement count is the number of times that you feel your baby move during a certain amount of time. This may also be called a fetal kick count. A fetal movement count is recommended for every pregnant woman. You may be asked to start counting fetal movements as early as week 28 of your pregnancy. Pay attention to when your baby is most active. You may notice your baby's sleep and wake cycles. You may also notice things that make your baby move more. You should do a fetal movement count:  When your baby is normally most active.  At the same time each day. A good time to count movements is while you are resting, after having something to eat and drink. How do I count fetal movements? 1. Find a quiet, comfortable area. Sit, or lie down on your side. 2. Write down the date, the start time and stop time, and the number of movements that you felt between those two times. Take this information with you to your health care visits. 3. For 2 hours, count kicks, flutters, swishes, rolls, and jabs. You should feel at least 10 movements during 2 hours. 4. You may stop counting after you have felt 10 movements. 5. If you do not feel 10 movements in 2 hours, have something to eat and drink. Then, keep resting and counting for 1 hour. If you feel at least 4 movements during that hour, you may stop counting. Contact a health care provider if:  You feel fewer than 4 movements in 2 hours.  Your baby is not moving like he or she usually does. Date: ____________ Start time: ____________ Stop time: ____________ Movements: ____________ Date: ____________ Start time: ____________ Stop time: ____________ Movements: ____________ Date: ____________ Start time: ____________ Stop time: ____________ Movements: ____________ Date: ____________ Start time: ____________  Stop time: ____________ Movements: ____________ Date: ____________ Start time: ____________ Stop time: ____________ Movements: ____________ Date: ____________ Start time: ____________ Stop time: ____________ Movements: ____________ Date: ____________ Start time: ____________ Stop time: ____________ Movements: ____________ Date: ____________ Start time: ____________ Stop time: ____________ Movements: ____________ Date: ____________ Start time: ____________ Stop time: ____________ Movements: ____________ This information is not intended to replace advice given to you by your health care provider. Make sure you discuss any questions you have with your health care provider. Document Released: 12/05/2006 Document Revised: 07/04/2016 Document Reviewed: 12/15/2015 Elsevier Interactive Patient Education  2017 [employer] Third Trimester of Pregnancy The third trimester is from week 29 through week 42, months 7 through 9. This trimester is when your unborn baby (fetus) is growing very fast. At the end of the ninth month, the unborn baby is about 20 inches in length. It weighs about 6-10 pounds. Follow these instructions at home:  Avoid all smoking, herbs, and alcohol. Avoid drugs not approved by your doctor.  Do not use any tobacco products, including cigarettes, chewing tobacco, and electronic cigarettes. If you need help quitting, ask your doctor. You may get counseling or other support to help you quit.  Only take medicine as told by your doctor. Some medicines are safe and some are not during pregnancy.  Exercise only as told by your doctor. Stop exercising if you start having cramps.  Eat regular, healthy meals.  Wear a good support bra if your breasts are tender.  Do not use hot tubs, steam rooms, or saunas.  Wear your seat belt when driving.  Avoid  raw meat, uncooked cheese, and liter boxes and soil used by cats.  Take your prenatal vitamins.  Take 1500-2000 milligrams of calcium  daily starting at the 20th week of pregnancy until you deliver your baby.  Try taking medicine that helps you poop (stool softener) as needed, and if your doctor approves. Eat more fiber by eating fresh fruit, vegetables, and whole grains. Drink enough fluids to keep your pee (urine) clear or pale yellow.  Take warm water baths (sitz baths) to soothe pain or discomfort caused by hemorrhoids. Use hemorrhoid cream if your doctor approves.  If you have puffy, bulging veins (varicose veins), wear support hose. Raise (elevate) your feet for 15 minutes, 3-4 times a day. Limit salt in your diet.  Avoid heavy lifting, wear low heels, and sit up straight.  Rest with your legs raised if you have leg cramps or low back pain.  Visit your dentist if you have not gone during your pregnancy. Use a soft toothbrush to brush your teeth. Be gentle when you floss.  You can have sex (intercourse) unless your doctor tells you not to.  Do not travel far distances unless you must. Only do so with your doctor's approval.  Take prenatal classes.  Practice driving to the hospital.  Pack your hospital bag.  Prepare the baby's room.  Go to your doctor visits. Get help if:  You are not sure if you are in labor or if your water has broken.  You are dizzy.  You have mild cramps or pressure in your lower belly (abdominal).  You have a nagging pain in your belly area.  You continue to feel sick to your stomach (nauseous), throw up (vomit), or have watery poop (diarrhea).  You have bad smelling fluid coming from your vagina.  You have pain with peeing (urination). Get help right away if:  You have a fever.  You are leaking fluid from your vagina.  You are spotting or bleeding from your vagina.  You have severe belly cramping or pain.  You lose or gain weight rapidly.  You have trouble catching your breath and have chest pain.  You notice sudden or extreme puffiness (swelling) of your face,  hands, ankles, feet, or legs.  You have not felt the baby move in over an hour.  You have severe headaches that do not go away with medicine.  You have vision changes. This information is not intended to replace advice given to you by your health care provider. Make sure you discuss any questions you have with your health care provider. Document Released: 01/30/2010 Document Revised: 04/12/2016 Document Reviewed: 01/06/2013 Elsevier Interactive Patient Education  2017 [employer]vier Inc[employer] of the uterus can occur throughout pregnancy. Contractions are not always a sign that you are in labor.  WHAT ARE BRAXTON HICKS CONTRACTIONS?  Contractions that occur before labor are called Braxton Hicks contractions, or false labor. Toward the end of pregnancy (32-34 weeks), these contractions can develop more often and may become more forceful. This is not true labor because these contractions do not result in opening (dilatation) and thinning of the cervix. They are sometimes difficult to tell apart from true labor because these contractions can be forceful and people have different pain tolerances. You should not feel embarrassed if you go to the hospital with false labor. Sometimes, the only way to tell if you are in true labor is for your health care provider to look for changes in the cervix. If there  are no prenatal problems or other health problems associated with the pregnancy, it is completely safe to be sent home with false labor and await the onset of true labor. HOW CAN YOU TELL THE DIFFERENCE BETWEEN TRUE AND FALSE LABOR? False Labor   The contractions of false labor are usually shorter and not as hard as those of true labor.   The contractions are usually irregular.   The contractions are often felt in the front of the lower abdomen and in the groin.   The contractions may go away when you walk around or change positions while lying down.   The  contractions get weaker and are shorter lasting as time goes on.   The contractions do not usually become progressively stronger, regular, and closer together as with true labor.  True Labor   Contractions in true labor last 30-70 seconds, become very regular, usually become more intense, and increase in frequency.   The contractions do not go away with walking.   The discomfort is usually felt in the top of the uterus and spreads to the lower abdomen and low back.   True labor can be determined by your health care provider with an exam. This will show that the cervix is dilating and getting thinner.  WHAT TO REMEMBER  Keep up with your usual exercises and follow other instructions given by your health care provider.   Take medicines as directed by your health care provider.   Keep your regular prenatal appointments.   Eat and drink lightly if you think you are going into labor.   If Braxton Hicks contractions are making you uncomfortable:   Change your position from lying down or resting to walking, or from walking to resting.   Sit and rest in a tub of warm water.   Drink 2-3 glasses of water. Dehydration may cause these contractions.   Do slow and deep breathing several times an hour.  WHEN SHOULD I SEEK IMMEDIATE MEDICAL CARE? Seek immediate medical care if:  Your contractions become stronger, more regular, and closer together.   You have fluid leaking or gushing from your vagina.   You have a fever.   You pass blood-tinged mucus.   You have vaginal bleeding.   You have continuous abdominal pain.   You have low back pain that you never had before.   You feel your baby's head pushing down and causing pelvic pressure.   Your baby is not moving as much as it used to.  This information is not intended to replace advice given to you by your health care provider. Make sure you discuss any questions you have with your health care  provider. Document Released: 11/05/2005 Document Revised: 02/27/2016 Document Reviewed: 08/17/2013 Elsevier Interactive Patient Education  2017 [employer].

## 2017-01-01 DIAGNOSIS — O26843 Uterine size-date discrepancy, third trimester: Secondary | ICD-10-CM | POA: Diagnosis not present

## 2017-01-01 DIAGNOSIS — Z3403 Encounter for supervision of normal first pregnancy, third trimester: Secondary | ICD-10-CM | POA: Diagnosis not present

## 2017-01-12 ENCOUNTER — Encounter (HOSPITAL_COMMUNITY): Payer: Self-pay

## 2017-01-12 ENCOUNTER — Inpatient Hospital Stay (HOSPITAL_COMMUNITY)
Admission: AD | Admit: 2017-01-12 | Discharge: 2017-01-12 | Disposition: A | Discharge status: Home / Self Care | Payer: 59 | Source: Ambulatory Visit | Attending: Obstetrics and Gynecology | Admitting: Obstetrics and Gynecology

## 2017-01-12 DIAGNOSIS — O321XX0 Maternal care for breech presentation, not applicable or unspecified: Secondary | ICD-10-CM | POA: Insufficient documentation

## 2017-01-12 DIAGNOSIS — O4703 False labor before 37 completed weeks of gestation, third trimester: Secondary | ICD-10-CM

## 2017-01-12 LAB — URINALYSIS, ROUTINE W REFLEX MICROSCOPIC
BILIRUBIN URINE: NEGATIVE
Glucose, UA: NEGATIVE mg/dL
Hgb urine dipstick: NEGATIVE
KETONES UR: NEGATIVE mg/dL
Leukocytes, UA: NEGATIVE
NITRITE: NEGATIVE
PH: 7 (ref 5.0–8.0)
PROTEIN: NEGATIVE mg/dL
Specific Gravity, Urine: 1.013 (ref 1.005–1.030)

## 2017-01-12 MED ORDER — NIFEDIPINE 10 MG PO CAPS: 10.0000 mg | ORAL_CAPSULE | Freq: Four times a day (QID) | ORAL | 0 refills | Status: DC | PRN

## 2017-01-12 MED ORDER — NIFEDIPINE 10 MG PO CAPS
10.0000 mg | ORAL_CAPSULE | Freq: Once | ORAL | Status: AC
  Administered 2017-01-12: 10 mg via ORAL
  Filled 2017-01-12: qty 1

## 2017-01-12 MED ORDER — ACETAMINOPHEN 500 MG PO TABS
1000.0000 mg | ORAL_TABLET | Freq: Once | ORAL | Status: AC
  Administered 2017-01-12: 1000 mg via ORAL
  Filled 2017-01-12: qty 2

## 2017-01-12 NOTE — MAU Note (Signed)
I have communicated with Dr Dion BodyVarnado and reviewed vital signs:   Vitals:   01/12/17 2027 01/12/17 2147  BP: 104/63 111/69  Pulse:  115  Resp:  16  Temp:  98.7 F (37.1 C)    Vaginal exam:  Dilation: Fingertip Effacement (%): 50 Station: -3 Presentation: Undeterminable Exam by:: Avery DennisonBenji Jamisen Hawes RN,   Also reviewed contraction pattern and that non-stress test is reactive.  It has been documented that patient is not contracting to an occasional contraction with UI.  Patient denies any other complaints and pain level is "0" now after Tylenol. Based on this report provider has given order for discharge.  A discharge diagnosis will be entered by a provider.  Labor discharge instructions reviewed with patient.    Patient voiced understanding.

## 2017-01-12 NOTE — MAU Note (Addendum)
About 1600 started having upper abd pain. Pain was every 30mins at first and have gradually gotten to 15mins. Now pain shoots to lower back. Drank flds and is not helping. Denies LOF or bleeding. Baby was breech last ck and to have u/s this Monday

## 2017-01-14 DIAGNOSIS — Z3403 Encounter for supervision of normal first pregnancy, third trimester: Secondary | ICD-10-CM | POA: Diagnosis not present

## 2017-01-14 DIAGNOSIS — O321XX0 Maternal care for breech presentation, not applicable or unspecified: Secondary | ICD-10-CM | POA: Diagnosis not present

## 2017-01-16 LAB — OB RESULTS CONSOLE GBS: STREP GROUP B AG: NEGATIVE

## 2017-01-28 ENCOUNTER — Encounter (HOSPITAL_COMMUNITY): Payer: Self-pay

## 2017-02-01 ENCOUNTER — Encounter (HOSPITAL_COMMUNITY)
Admission: RE | Admit: 2017-02-01 | Discharge: 2017-02-01 | Disposition: A | Discharge status: Home / Self Care | Payer: 59 | Source: Ambulatory Visit | Attending: Obstetrics and Gynecology | Admitting: Obstetrics and Gynecology

## 2017-02-01 DIAGNOSIS — O9962 Diseases of the digestive system complicating childbirth: Secondary | ICD-10-CM | POA: Diagnosis not present

## 2017-02-01 DIAGNOSIS — O24420 Gestational diabetes mellitus in childbirth, diet controlled: Secondary | ICD-10-CM | POA: Diagnosis not present

## 2017-02-01 DIAGNOSIS — O99214 Obesity complicating childbirth: Secondary | ICD-10-CM | POA: Diagnosis not present

## 2017-02-01 DIAGNOSIS — K5900 Constipation, unspecified: Secondary | ICD-10-CM | POA: Diagnosis not present

## 2017-02-01 DIAGNOSIS — E669 Obesity, unspecified: Secondary | ICD-10-CM | POA: Diagnosis not present

## 2017-02-01 DIAGNOSIS — K219 Gastro-esophageal reflux disease without esophagitis: Secondary | ICD-10-CM | POA: Diagnosis not present

## 2017-02-01 DIAGNOSIS — O328XX0 Maternal care for other malpresentation of fetus, not applicable or unspecified: Secondary | ICD-10-CM | POA: Diagnosis not present

## 2017-02-01 DIAGNOSIS — O9081 Anemia of the puerperium: Secondary | ICD-10-CM | POA: Diagnosis not present

## 2017-02-01 DIAGNOSIS — D649 Anemia, unspecified: Secondary | ICD-10-CM | POA: Diagnosis not present

## 2017-02-01 LAB — GLUCOSE, RANDOM: Glucose, Bld: 116 mg/dL — ABNORMAL HIGH (ref 65–99)

## 2017-02-01 LAB — CBC
HCT: 30 % — ABNORMAL LOW (ref 36.0–46.0)
Hemoglobin: 10.3 g/dL — ABNORMAL LOW (ref 12.0–15.0)
MCH: 27.6 pg (ref 26.0–34.0)
MCHC: 34.3 g/dL (ref 30.0–36.0)
MCV: 80.4 fL (ref 78.0–100.0)
Platelets: 229 10*3/uL (ref 150–400)
RBC: 3.73 MIL/uL — ABNORMAL LOW (ref 3.87–5.11)
RDW: 15.3 % (ref 11.5–15.5)
WBC: 10.7 10*3/uL — AB (ref 4.0–10.5)

## 2017-02-01 LAB — ABO/RH: ABO/RH(D): A POS

## 2017-02-01 NOTE — Patient Instructions (Signed)
22 Sydney Dyer  02/01/2017   Your procedure is scheduled on:  02/04/2017   Enter through [facility] at 1245 PM.  Pick up the phone at the desk and dial [phone].   Call this number if you have problems the morning of surgery: [phone]   Remember:   Do not eat food:After Midnight.  Do not drink clear liquids: After Midnight.  Take these medicines the morning of surgery with A SIP OF WATER: PEPCID   Do not wear jewelry, make-up or nail polish.  Do not wear lotions, powders, or perfumes. Do not wear deodorant.  Do not shave 48 hours prior to surgery.  Do not bring valuables to the hospital.  [facility] is not   responsible for any belongings or valuables brought to the hospital.  Contacts, dentures or bridgework may not be worn into surgery.  Leave suitcase in the car. After surgery it may be brought to your room.  For patients admitted to the hospital, checkout time is 11:00 AM the day of              discharge.   Patients discharged the day of surgery will not be allowed to drive             home.  Name and phone number of your driver: NA  Special Instructions:   N/A   Please read over the following fact sheets that you were given:   Surgical Site Infection Prevention

## 2017-02-02 LAB — TYPE AND SCREEN
ABO/RH(D): A POS
Antibody Screen: NEGATIVE
UNIT DIVISION: 0

## 2017-02-02 LAB — BPAM RBC
Blood Product Expiration Date: 201804052359
Unit Type and Rh: 5100

## 2017-02-02 LAB — RPR: RPR Ser Ql: NONREACTIVE

## 2017-02-03 ENCOUNTER — Encounter (HOSPITAL_COMMUNITY): Payer: Self-pay

## 2017-02-03 ENCOUNTER — Inpatient Hospital Stay (HOSPITAL_COMMUNITY)
Admission: RE | Admit: 2017-02-03 | Discharge: 2017-02-03 | Disposition: A | Discharge status: Home / Self Care | Payer: 59 | Source: Ambulatory Visit | Attending: Obstetrics and Gynecology | Admitting: Obstetrics and Gynecology

## 2017-02-03 DIAGNOSIS — O321XX0 Maternal care for breech presentation, not applicable or unspecified: Secondary | ICD-10-CM

## 2017-02-03 NOTE — H&P (Signed)
Sydney Dyer is a 25 y.o. female G1 @ 38 3/7 weeks c/w 12 week ultrasound presenting for primary cesarean section due to Breech presentation.   PNC with [facility] (Dr. Faigy Stretch) since 10 weeks.  OB History    Gravida Para Term Preterm AB Living   1             SAB TAB Ectopic Multiple Live Births                 Past Medical History:  Diagnosis Date  . GERD (gastroesophageal reflux disease)   . Gestational diabetes   . Pneumonia    Past Surgical History:  Procedure Laterality Date  . NO PAST SURGERIES     Family History: family history includes Breast cancer in her maternal aunt; Diabetes in her brother; Hypertension in her father and mother. Social History:  reports that she has never smoked. She has never used smokeless tobacco. She reports that she drinks alcohol. She reports that she does not use drugs.     Maternal Diabetes: Yes:  Diabetes Type:  Diet controlled Genetic Screening: Normal Maternal Ultrasounds/Referrals: Normal Fetal Ultrasounds or other Referrals:  None Maternal Substance Abuse:  No Significant Maternal Medications:  Meds include: Other: Pepcid, Zofran, Diclegis Significant Maternal Lab Results:  Lab values include: Group B Strep negative Other Comments:  Persistent Breech  Review of Systems  Constitutional: Negative.   Gastrointestinal: Positive for abdominal pain.   Maternal Medical History:  Contractions: Frequency: irregular.   Perceived severity is moderate.    Fetal activity: Perceived fetal activity is normal.    Prenatal Complications - Diabetes: gestational. Diabetes is managed by diet.        Last menstrual period 03/16/2016. Maternal Exam:  Abdomen: Patient reports no abdominal tenderness. Estimated fetal weight is 7 lbs.   Fetal presentation: breech  Introitus: Normal vulva.   Fetal Exam Fetal Monitor Review: Mode: hand-held doppler probe.   Baseline rate: 140s.      Physical Exam  Constitutional: She is oriented to  person, place, and time. She appears well-developed and well-nourished. No distress.  HENT:  Head: Normocephalic and atraumatic.  Eyes: Conjunctivae and EOM are normal.  Neck: Normal range of motion.  Cardiovascular: Normal rate, regular rhythm and normal heart sounds.   Respiratory: Effort normal. No respiratory distress.  GI: There is no tenderness.  Musculoskeletal: Normal range of motion. She exhibits edema.  Neurological: She is alert and oriented to person, place, and time.  Skin: Skin is warm and dry.  Psychiatric: She has a normal mood and affect.    Prenatal labs: ABO, Rh: --/--/A POS, A POS (03/16 1015) Antibody: NEG (03/16 1015) Rubella: Immune (08/04 0000) RPR: Non Reactive (03/16 1015)  HBsAg: Negative (08/04 0000)  HIV: Non-reactive (08/04 0000)  GBS: Negative (02/28 0000)   Assessment/Plan: IUP @ 38 3/7  Weeks Breech presentation-Declined external version.  Desires cesarean section. Keloid  Proceed with Primary c-section.  Counseled on R/B/A. Kenalog w/ Lidocaine with closure.  Pt counseled on risk of hypopigmentation with Kenalog.  Pt desires to proceed.  Sydney Dyer 02/03/2017, 10:36 PM    

## 2017-02-03 NOTE — MAU Note (Signed)
Pt presents complaining of pain in her abdomen and lower back pain while she was in the shower. Also had nausea this evening and some pelvic pain. Denies bleeding or leaking of fluid. Reports good fetal movement.

## 2017-02-04 ENCOUNTER — Inpatient Hospital Stay (HOSPITAL_COMMUNITY): Payer: 59 | Admitting: Anesthesiology

## 2017-02-04 ENCOUNTER — Encounter (HOSPITAL_COMMUNITY): Payer: Self-pay

## 2017-02-04 ENCOUNTER — Inpatient Hospital Stay (HOSPITAL_COMMUNITY)
Admission: AD | Admit: 2017-02-04 | Discharge: 2017-02-06 | DRG: 766 | Disposition: A | Discharge status: Home / Self Care | Payer: 59 | Source: Ambulatory Visit | Attending: Obstetrics and Gynecology | Admitting: Obstetrics and Gynecology

## 2017-02-04 ENCOUNTER — Encounter (HOSPITAL_COMMUNITY)
Admission: AD | Disposition: A | Discharge status: Home / Self Care | Payer: Self-pay | Source: Ambulatory Visit | Attending: Obstetrics and Gynecology

## 2017-02-04 DIAGNOSIS — Z3A39 39 weeks gestation of pregnancy: Secondary | ICD-10-CM | POA: Diagnosis not present

## 2017-02-04 DIAGNOSIS — Z98891 History of uterine scar from previous surgery: Secondary | ICD-10-CM

## 2017-02-04 DIAGNOSIS — O99214 Obesity complicating childbirth: Secondary | ICD-10-CM | POA: Diagnosis present

## 2017-02-04 DIAGNOSIS — O24420 Gestational diabetes mellitus in childbirth, diet controlled: Secondary | ICD-10-CM | POA: Diagnosis present

## 2017-02-04 DIAGNOSIS — L910 Hypertrophic scar: Secondary | ICD-10-CM | POA: Diagnosis present

## 2017-02-04 DIAGNOSIS — Z833 Family history of diabetes mellitus: Secondary | ICD-10-CM | POA: Diagnosis not present

## 2017-02-04 DIAGNOSIS — Z6830 Body mass index (BMI) 30.0-30.9, adult: Secondary | ICD-10-CM

## 2017-02-04 DIAGNOSIS — E669 Obesity, unspecified: Secondary | ICD-10-CM | POA: Diagnosis present

## 2017-02-04 DIAGNOSIS — D649 Anemia, unspecified: Secondary | ICD-10-CM | POA: Diagnosis not present

## 2017-02-04 DIAGNOSIS — O9962 Diseases of the digestive system complicating childbirth: Secondary | ICD-10-CM | POA: Diagnosis present

## 2017-02-04 DIAGNOSIS — K219 Gastro-esophageal reflux disease without esophagitis: Secondary | ICD-10-CM | POA: Diagnosis present

## 2017-02-04 DIAGNOSIS — O9081 Anemia of the puerperium: Secondary | ICD-10-CM | POA: Diagnosis not present

## 2017-02-04 DIAGNOSIS — Z8249 Family history of ischemic heart disease and other diseases of the circulatory system: Secondary | ICD-10-CM | POA: Diagnosis not present

## 2017-02-04 DIAGNOSIS — O321XX0 Maternal care for breech presentation, not applicable or unspecified: Secondary | ICD-10-CM

## 2017-02-04 DIAGNOSIS — Z3A38 38 weeks gestation of pregnancy: Secondary | ICD-10-CM | POA: Diagnosis not present

## 2017-02-04 DIAGNOSIS — K5900 Constipation, unspecified: Secondary | ICD-10-CM | POA: Diagnosis present

## 2017-02-04 DIAGNOSIS — O328XX0 Maternal care for other malpresentation of fetus, not applicable or unspecified: Principal | ICD-10-CM | POA: Diagnosis present

## 2017-02-04 LAB — GLUCOSE, CAPILLARY
GLUCOSE-CAPILLARY: 71 mg/dL (ref 65–99)
GLUCOSE-CAPILLARY: 82 mg/dL (ref 65–99)

## 2017-02-04 SURGERY — Surgical Case
Anesthesia: Regional

## 2017-02-04 SURGERY — Surgical Case
Anesthesia: Spinal

## 2017-02-04 MED ORDER — ONDANSETRON HCL 4 MG/2ML IJ SOLN: 4.0000 mg | Freq: Three times a day (TID) | INTRAMUSCULAR | Status: DC | PRN

## 2017-02-04 MED ORDER — NALBUPHINE HCL 10 MG/ML IJ SOLN: 5.0000 mg | Freq: Once | INTRAMUSCULAR | Status: DC | PRN

## 2017-02-04 MED ORDER — TETANUS-DIPHTH-ACELL PERTUSSIS 5-2.5-18.5 LF-MCG/0.5 IM SUSP: 0.5000 mL | Freq: Once | INTRAMUSCULAR | Status: DC

## 2017-02-04 MED ORDER — SCOPOLAMINE 1 MG/3DAYS TD PT72
MEDICATED_PATCH | TRANSDERMAL | Status: DC | PRN
  Administered 2017-02-04: 1 via TRANSDERMAL

## 2017-02-04 MED ORDER — BUPIVACAINE IN DEXTROSE 0.75-8.25 % IT SOLN
INTRATHECAL | Status: DC | PRN
  Administered 2017-02-04: 1.6 mL via INTRATHECAL

## 2017-02-04 MED ORDER — PHENYLEPHRINE 8 MG IN D5W 100 ML (0.08MG/ML) PREMIX OPTIME
INJECTION | INTRAVENOUS | Status: DC | PRN
  Administered 2017-02-04: 60 ug/min via INTRAVENOUS

## 2017-02-04 MED ORDER — LIDOCAINE HCL 1 % IJ SOLN
10.0000 mL | Freq: Once | INTRAMUSCULAR | Status: DC
  Filled 2017-02-04: qty 10

## 2017-02-04 MED ORDER — KETOROLAC TROMETHAMINE 30 MG/ML IJ SOLN
30.0000 mg | Freq: Four times a day (QID) | INTRAMUSCULAR | Status: AC | PRN
  Administered 2017-02-04 – 2017-02-05 (×2): 30 mg via INTRAVENOUS
  Filled 2017-02-04 (×2): qty 1

## 2017-02-04 MED ORDER — SIMETHICONE 80 MG PO CHEW
80.0000 mg | CHEWABLE_TABLET | Freq: Three times a day (TID) | ORAL | Status: DC
Start: 2017-02-04 — End: 2017-02-06
  Administered 2017-02-05 – 2017-02-06 (×5): 80 mg via ORAL
  Filled 2017-02-04 (×5): qty 1

## 2017-02-04 MED ORDER — SCOPOLAMINE 1 MG/3DAYS TD PT72
MEDICATED_PATCH | TRANSDERMAL | Status: AC
  Filled 2017-02-04: qty 1

## 2017-02-04 MED ORDER — TRIAMCINOLONE ACETONIDE 40 MG/ML IJ SUSP
40.0000 mg | Freq: Once | INTRAMUSCULAR | Status: DC
  Filled 2017-02-04: qty 1

## 2017-02-04 MED ORDER — KETOROLAC TROMETHAMINE 30 MG/ML IJ SOLN
INTRAMUSCULAR | Status: AC
  Administered 2017-02-04: 30 mg via INTRAVENOUS
  Filled 2017-02-04: qty 1

## 2017-02-04 MED ORDER — DIPHENHYDRAMINE HCL 25 MG PO CAPS
25.0000 mg | ORAL_CAPSULE | ORAL | Status: DC | PRN
  Filled 2017-02-04: qty 1

## 2017-02-04 MED ORDER — OXYCODONE-ACETAMINOPHEN 5-325 MG PO TABS: 2.0000 | ORAL_TABLET | ORAL | Status: DC | PRN

## 2017-02-04 MED ORDER — ACETAMINOPHEN 500 MG PO TABS
1000.0000 mg | ORAL_TABLET | Freq: Four times a day (QID) | ORAL | Status: AC
  Administered 2017-02-04 – 2017-02-05 (×2): 1000 mg via ORAL
  Filled 2017-02-04 (×2): qty 2

## 2017-02-04 MED ORDER — WITCH HAZEL-GLYCERIN EX PADS: 1.0000 "application " | MEDICATED_PAD | CUTANEOUS | Status: DC | PRN

## 2017-02-04 MED ORDER — OXYCODONE-ACETAMINOPHEN 5-325 MG PO TABS
1.0000 | ORAL_TABLET | ORAL | Status: DC | PRN
  Administered 2017-02-05 – 2017-02-06 (×3): 1 via ORAL
  Filled 2017-02-04 (×3): qty 1

## 2017-02-04 MED ORDER — DEXAMETHASONE SODIUM PHOSPHATE 4 MG/ML IJ SOLN
INTRAMUSCULAR | Status: AC
  Filled 2017-02-04: qty 1

## 2017-02-04 MED ORDER — LACTATED RINGERS IV SOLN
INTRAVENOUS | Status: DC | PRN
  Administered 2017-02-04: 40 [IU] via INTRAVENOUS

## 2017-02-04 MED ORDER — LIDOCAINE HCL 1 % IJ SOLN
INTRAMUSCULAR | Status: DC | PRN
  Administered 2017-02-04: 10 mL

## 2017-02-04 MED ORDER — CEFAZOLIN SODIUM-DEXTROSE 2-4 GM/100ML-% IV SOLN: 2.0000 g | INTRAVENOUS | Status: DC

## 2017-02-04 MED ORDER — MORPHINE SULFATE (PF) 0.5 MG/ML IJ SOLN
INTRAMUSCULAR | Status: DC | PRN
  Administered 2017-02-04: .2 mg via EPIDURAL

## 2017-02-04 MED ORDER — SODIUM CHLORIDE 0.9 % IR SOLN
Status: DC | PRN
  Administered 2017-02-04: 1

## 2017-02-04 MED ORDER — MENTHOL 3 MG MT LOZG: 1.0000 | LOZENGE | OROMUCOSAL | Status: DC | PRN

## 2017-02-04 MED ORDER — NALOXONE HCL 0.4 MG/ML IJ SOLN: 0.4000 mg | INTRAMUSCULAR | Status: DC | PRN

## 2017-02-04 MED ORDER — SIMETHICONE 80 MG PO CHEW: 80.0000 mg | CHEWABLE_TABLET | ORAL | Status: DC | PRN

## 2017-02-04 MED ORDER — CEFAZOLIN SODIUM-DEXTROSE 2-4 GM/100ML-% IV SOLN
2.0000 g | INTRAVENOUS | Status: AC
  Administered 2017-02-04: 2 g via INTRAVENOUS

## 2017-02-04 MED ORDER — FENTANYL CITRATE (PF) 100 MCG/2ML IJ SOLN
25.0000 ug | INTRAMUSCULAR | Status: DC | PRN
  Administered 2017-02-04: 25 ug via INTRAVENOUS
  Administered 2017-02-04: 50 ug via INTRAVENOUS

## 2017-02-04 MED ORDER — SENNOSIDES-DOCUSATE SODIUM 8.6-50 MG PO TABS
2.0000 | ORAL_TABLET | ORAL | Status: DC
  Administered 2017-02-04 – 2017-02-05 (×2): 2 via ORAL
  Filled 2017-02-04 (×2): qty 2

## 2017-02-04 MED ORDER — IBUPROFEN 600 MG PO TABS
600.0000 mg | ORAL_TABLET | Freq: Four times a day (QID) | ORAL | Status: DC
  Administered 2017-02-05 – 2017-02-06 (×6): 600 mg via ORAL
  Filled 2017-02-04 (×6): qty 1

## 2017-02-04 MED ORDER — SODIUM CHLORIDE 0.9% FLUSH: 3.0000 mL | INTRAVENOUS | Status: DC | PRN

## 2017-02-04 MED ORDER — ONDANSETRON HCL 4 MG/2ML IJ SOLN
INTRAMUSCULAR | Status: AC
  Filled 2017-02-04: qty 2

## 2017-02-04 MED ORDER — ZOLPIDEM TARTRATE 5 MG PO TABS: 5.0000 mg | ORAL_TABLET | Freq: Every evening | ORAL | Status: DC | PRN

## 2017-02-04 MED ORDER — FENTANYL CITRATE (PF) 100 MCG/2ML IJ SOLN
INTRAMUSCULAR | Status: DC | PRN
  Administered 2017-02-04: 10 ug via INTRAVENOUS

## 2017-02-04 MED ORDER — FENTANYL CITRATE (PF) 100 MCG/2ML IJ SOLN
INTRAMUSCULAR | Status: AC
  Filled 2017-02-04: qty 2

## 2017-02-04 MED ORDER — NALOXONE HCL 2 MG/2ML IJ SOSY
1.0000 ug/kg/h | PREFILLED_SYRINGE | INTRAVENOUS | Status: DC | PRN
  Filled 2017-02-04: qty 2

## 2017-02-04 MED ORDER — OXYTOCIN 10 UNIT/ML IJ SOLN
INTRAMUSCULAR | Status: AC
  Filled 2017-02-04: qty 4

## 2017-02-04 MED ORDER — DIPHENHYDRAMINE HCL 50 MG/ML IJ SOLN: 12.5000 mg | INTRAMUSCULAR | Status: DC | PRN

## 2017-02-04 MED ORDER — DEXAMETHASONE SODIUM PHOSPHATE 4 MG/ML IJ SOLN
INTRAMUSCULAR | Status: DC | PRN
  Administered 2017-02-04: 4 mg via INTRAVENOUS

## 2017-02-04 MED ORDER — LACTATED RINGERS IV SOLN
INTRAVENOUS | Status: DC
  Administered 2017-02-04 (×2): via INTRAVENOUS

## 2017-02-04 MED ORDER — ACETAMINOPHEN 325 MG PO TABS: 650.0000 mg | ORAL_TABLET | ORAL | Status: DC | PRN

## 2017-02-04 MED ORDER — OXYTOCIN 40 UNITS IN LACTATED RINGERS INFUSION - SIMPLE MED: 2.5000 [IU]/h | INTRAVENOUS | Status: AC

## 2017-02-04 MED ORDER — LIDOCAINE HCL 1 % IJ SOLN
INTRAMUSCULAR | Status: AC
  Filled 2017-02-04: qty 20

## 2017-02-04 MED ORDER — METHYLERGONOVINE MALEATE 0.2 MG PO TABS: 0.2000 mg | ORAL_TABLET | ORAL | Status: DC | PRN

## 2017-02-04 MED ORDER — TRIAMCINOLONE ACETONIDE 40 MG/ML IJ SUSP
INTRAMUSCULAR | Status: DC | PRN
  Administered 2017-02-04: 40 mg via INTRAMUSCULAR

## 2017-02-04 MED ORDER — KETOROLAC TROMETHAMINE 30 MG/ML IJ SOLN
30.0000 mg | Freq: Four times a day (QID) | INTRAMUSCULAR | Status: AC | PRN
  Administered 2017-02-04: 30 mg via INTRAMUSCULAR

## 2017-02-04 MED ORDER — PRENATAL MULTIVITAMIN CH
1.0000 | ORAL_TABLET | Freq: Every day | ORAL | Status: DC
Start: 2017-02-05 — End: 2017-02-06
  Administered 2017-02-05 – 2017-02-06 (×2): 1 via ORAL
  Filled 2017-02-04 (×2): qty 1

## 2017-02-04 MED ORDER — PHENYLEPHRINE 8 MG IN D5W 100 ML (0.08MG/ML) PREMIX OPTIME
INJECTION | INTRAVENOUS | Status: AC
  Filled 2017-02-04: qty 100

## 2017-02-04 MED ORDER — LACTATED RINGERS IV SOLN
INTRAVENOUS | Status: DC
  Administered 2017-02-05: 01:00:00 via INTRAVENOUS

## 2017-02-04 MED ORDER — NALBUPHINE HCL 10 MG/ML IJ SOLN: 5.0000 mg | INTRAMUSCULAR | Status: DC | PRN

## 2017-02-04 MED ORDER — MORPHINE SULFATE (PF) 0.5 MG/ML IJ SOLN
INTRAMUSCULAR | Status: AC
  Filled 2017-02-04: qty 10

## 2017-02-04 MED ORDER — ONDANSETRON HCL 4 MG/2ML IJ SOLN
INTRAMUSCULAR | Status: DC | PRN
  Administered 2017-02-04: 4 mg via INTRAVENOUS

## 2017-02-04 MED ORDER — SIMETHICONE 80 MG PO CHEW
80.0000 mg | CHEWABLE_TABLET | ORAL | Status: DC
  Administered 2017-02-04 – 2017-02-05 (×2): 80 mg via ORAL
  Filled 2017-02-04 (×2): qty 1

## 2017-02-04 MED ORDER — COCONUT OIL OIL
1.0000 "application " | TOPICAL_OIL | Status: DC | PRN
  Filled 2017-02-04: qty 120

## 2017-02-04 MED ORDER — PROMETHAZINE HCL 25 MG/ML IJ SOLN: 6.2500 mg | INTRAMUSCULAR | Status: DC | PRN

## 2017-02-04 MED ORDER — ERYTHROMYCIN 5 MG/GM OP OINT
TOPICAL_OINTMENT | OPHTHALMIC | Status: AC
  Filled 2017-02-04: qty 1

## 2017-02-04 MED ORDER — METHYLERGONOVINE MALEATE 0.2 MG/ML IJ SOLN: 0.2000 mg | INTRAMUSCULAR | Status: DC | PRN

## 2017-02-04 MED ORDER — DIBUCAINE 1 % RE OINT: 1.0000 "application " | TOPICAL_OINTMENT | RECTAL | Status: DC | PRN

## 2017-02-04 MED ORDER — MEPERIDINE HCL 25 MG/ML IJ SOLN: 6.2500 mg | INTRAMUSCULAR | Status: DC | PRN

## 2017-02-04 MED ORDER — DIPHENHYDRAMINE HCL 25 MG PO CAPS: 25.0000 mg | ORAL_CAPSULE | Freq: Four times a day (QID) | ORAL | Status: DC | PRN

## 2017-02-04 SURGICAL SUPPLY — 38 items
BARRIER ADHS 3X4 INTERCEED (GAUZE/BANDAGES/DRESSINGS) ×6 IMPLANT
BENZOIN TINCTURE PRP APPL 2/3 (GAUZE/BANDAGES/DRESSINGS) ×3 IMPLANT
CHLORAPREP W/TINT 26ML (MISCELLANEOUS) ×3 IMPLANT
CLAMP CORD UMBIL (MISCELLANEOUS) IMPLANT
CLOSURE WOUND 1/2 X4 (GAUZE/BANDAGES/DRESSINGS) ×1
CLOTH BEACON ORANGE TIMEOUT ST (SAFETY) ×3 IMPLANT
DERMABOND ADVANCED (GAUZE/BANDAGES/DRESSINGS)
DERMABOND ADVANCED .7 DNX12 (GAUZE/BANDAGES/DRESSINGS) IMPLANT
DRSG OPSITE POSTOP 4X10 (GAUZE/BANDAGES/DRESSINGS) ×3 IMPLANT
ELECT REM PT RETURN 9FT ADLT (ELECTROSURGICAL) ×3
ELECTRODE REM PT RTRN 9FT ADLT (ELECTROSURGICAL) ×1 IMPLANT
EXTRACTOR VACUUM BELL STYLE (SUCTIONS) IMPLANT
GLOVE BIO SURGEON STRL SZ7 (GLOVE) ×3 IMPLANT
GLOVE BIOGEL PI IND STRL 7.0 (GLOVE) ×2 IMPLANT
GLOVE BIOGEL PI INDICATOR 7.0 (GLOVE) ×4
GOWN STRL REUS W/TWL LRG LVL3 (GOWN DISPOSABLE) ×6 IMPLANT
KIT ABG SYR 3ML LUER SLIP (SYRINGE) IMPLANT
NEEDLE HYPO 22GX1.5 SAFETY (NEEDLE) ×3 IMPLANT
NEEDLE HYPO 25X5/8 SAFETYGLIDE (NEEDLE) IMPLANT
NS IRRIG 1000ML POUR BTL (IV SOLUTION) ×3 IMPLANT
PACK C SECTION WH (CUSTOM PROCEDURE TRAY) ×3 IMPLANT
PAD ABD 7.5X8 STRL (GAUZE/BANDAGES/DRESSINGS) ×3 IMPLANT
PAD OB MATERNITY 4.3X12.25 (PERSONAL CARE ITEMS) ×3 IMPLANT
PENCIL SMOKE EVAC W/HOLSTER (ELECTROSURGICAL) ×3 IMPLANT
RTRCTR C-SECT PINK 25CM LRG (MISCELLANEOUS) ×3 IMPLANT
SPONGE GAUZE 4X4 12PLY STER LF (GAUZE/BANDAGES/DRESSINGS) ×6 IMPLANT
STRIP CLOSURE SKIN 1/2X4 (GAUZE/BANDAGES/DRESSINGS) ×2 IMPLANT
SUT CHROMIC 0 CTX 36 (SUTURE) IMPLANT
SUT MON AB 4-0 PS1 27 (SUTURE) ×3 IMPLANT
SUT PLAIN 0 NONE (SUTURE) IMPLANT
SUT PLAIN 2 0 XLH (SUTURE) ×6 IMPLANT
SUT VIC AB 0 CTX 36 (SUTURE) ×10
SUT VIC AB 0 CTX36XBRD ANBCTRL (SUTURE) ×5 IMPLANT
SUT VIC AB 2-0 CT1 27 (SUTURE) ×2
SUT VIC AB 2-0 CT1 TAPERPNT 27 (SUTURE) ×1 IMPLANT
SYR CONTROL 10ML LL (SYRINGE) ×3 IMPLANT
TOWEL OR 17X24 6PK STRL BLUE (TOWEL DISPOSABLE) ×3 IMPLANT
TRAY FOLEY CATH SILVER 14FR (SET/KITS/TRAYS/PACK) IMPLANT

## 2017-02-04 NOTE — Anesthesia Procedure Notes (Signed)
Spinal  Patient location during procedure: OR Start time: 02/04/2017 1:36 PM End time: 02/04/2017 1:38 PM Staffing Anesthesiologist: Cecile HearingURK, Sydney EDWARD Performed: anesthesiologist  Preanesthetic Checklist Completed: patient identified, surgical consent, pre-op evaluation, timeout performed, IV checked, risks and benefits discussed and monitors and equipment checked Spinal Block Patient position: sitting Prep: site prepped and draped and DuraPrep Patient monitoring: continuous pulse ox and blood pressure Approach: midline Location: L3-4 Injection technique: single-shot Needle Needle type: Sprotte  Needle gauge: 24 G Needle length: 9 cm Assessment Sensory level: T6 Additional Notes Functioning IV was confirmed and monitors were applied. Sterile prep and drape, including hand hygiene, mask and sterile gloves were used. The patient was positioned and the spine was prepped. The skin was anesthetized with lidocaine.  Free flow of clear CSF was obtained prior to injecting local anesthetic into the CSF.  The spinal needle aspirated freely following injection.  The needle was carefully withdrawn.  The patient tolerated the procedure well. Consent was obtained prior to procedure with all questions answered and concerns addressed. Risks including but not limited to bleeding, infection, nerve damage, paralysis, failed block, inadequate analgesia, allergic reaction, high spinal, itching and headache were discussed and the patient wished to proceed.   Sydney AranStephen Turk, MD

## 2017-02-04 NOTE — Transfer of Care (Signed)
Immediate Anesthesia Transfer of Care Note  Patient: Sydney Dyer  Procedure(s) Performed: Procedure(s): CESAREAN SECTION (N/A)  Patient Location: PACU  Anesthesia Type:Spinal  Level of Consciousness: awake, alert  and oriented  Airway & Oxygen Therapy: Patient Spontanous Breathing  Post-op Assessment: Report given to RN and Post -op Vital signs reviewed and stable  Post vital signs: Reviewed and stable  Last Vitals:  Vitals:   02/04/17 1255  BP: 97/61  Pulse: 97  Resp: 16  Temp: 36.9 C    Last Pain:  Vitals:   02/04/17 1300  TempSrc:   PainSc: 0-No pain         Complications: No apparent anesthesia complications and neck pain

## 2017-02-04 NOTE — Addendum Note (Signed)
Addendum  created 02/04/17 2029 by Shanon PayorSuzanne M Charlcie Prisco, CRNA   Sign clinical note

## 2017-02-04 NOTE — Anesthesia Postprocedure Evaluation (Signed)
Anesthesia Post Note  Patient: Josiah Loboanisha Yeh  Procedure(s) Performed: Procedure(s) (LRB): CESAREAN SECTION (N/A)  Patient location during evaluation: PACU Anesthesia Type: Spinal Level of consciousness: oriented and awake and alert Pain management: pain level controlled Vital Signs Assessment: post-procedure vital signs reviewed and stable Respiratory status: spontaneous breathing, respiratory function stable and patient connected to nasal cannula oxygen Cardiovascular status: blood pressure returned to baseline and stable Postop Assessment: no headache, no backache, spinal receding, no signs of nausea or vomiting and patient able to bend at knees Anesthetic complications: no        Last Vitals:  Vitals:   02/04/17 1600 02/04/17 1615  BP: 105/86 123/72  Pulse: 88 94  Resp: 19 (!) 23  Temp:  37 C    Last Pain:  Vitals:   02/04/17 1628  TempSrc:   PainSc: 3    Pain Goal:                 Cecile HearingStephen Edward Turk

## 2017-02-04 NOTE — Anesthesia Postprocedure Evaluation (Signed)
Anesthesia Post Note  Patient: Sydney Dyer  Procedure(s) Performed: Procedure(s) (LRB): CESAREAN SECTION (N/A)  Patient location during evaluation: Mother Baby Anesthesia Type: Spinal Level of consciousness: awake and alert and oriented Pain management: satisfactory to patient Vital Signs Assessment: post-procedure vital signs reviewed and stable Respiratory status: spontaneous breathing and nonlabored ventilation Cardiovascular status: stable Postop Assessment: no headache, no backache, patient able to bend at knees, no signs of nausea or vomiting and adequate PO intake Anesthetic complications: no        Last Vitals:  Vitals:   02/04/17 1800 02/04/17 1930  BP: 121/72 121/71  Pulse: 86 86  Resp: 18 16  Temp: 36.8 C 37.3 C    Last Pain:  Vitals:   02/04/17 2023  TempSrc:   PainSc: 2    Pain Goal: Patients Stated Pain Goal: 4 (02/04/17 1648)               Madison HickmanGREGORY,Joquan Lotz

## 2017-02-04 NOTE — Anesthesia Preprocedure Evaluation (Addendum)
Anesthesia Evaluation  Patient identified by MRN, date of birth, ID band Patient awake    Reviewed: Allergy & Precautions, NPO status , Patient's Chart, lab work & pertinent test results  Airway Mallampati: II  TM Distance: >3 FB Neck ROM: Full    Dental  (+) Teeth Intact, Dental Advisory Given   Pulmonary neg pulmonary ROS,    Pulmonary exam normal breath sounds clear to auscultation       Cardiovascular Exercise Tolerance: Good negative cardio ROS Normal cardiovascular exam Rhythm:Regular Rate:Normal     Neuro/Psych negative neurological ROS  negative psych ROS   GI/Hepatic Neg liver ROS, GERD  Medicated,  Endo/Other  diabetes, GestationalObesity   Renal/GU negative Renal ROS     Musculoskeletal negative musculoskeletal ROS (+)   Abdominal   Peds  Hematology  (+) Blood dyscrasia, anemia ,   Anesthesia Other Findings Day of surgery medications reviewed with the patient.  Reproductive/Obstetrics (+) Pregnancy                             Anesthesia Physical Anesthesia Plan  ASA: II  Anesthesia Plan: Spinal   Post-op Pain Management:    Induction:   Airway Management Planned:   Additional Equipment:   Intra-op Plan:   Post-operative Plan:   Informed Consent: I have reviewed the patients History and Physical, chart, labs and discussed the procedure including the risks, benefits and alternatives for the proposed anesthesia with the patient or authorized representative who has indicated his/her understanding and acceptance.   Dental advisory given  Plan Discussed with: CRNA, Anesthesiologist and Surgeon  Anesthesia Plan Comments: (Discussed risks and benefits of and differences between spinal and general. Discussed risks of spinal including headache, backache, failure, bleeding, infection, and nerve damage. Patient consents to spinal. Questions answered. Coagulation studies  and platelet count acceptable.)        Anesthesia Quick Evaluation

## 2017-02-04 NOTE — Op Note (Signed)
NAME:  Sydney Dyer, Sydney Dyer NO.:  [id number]  MEDICAL RECORD NO.:  [id number]  LOCATION:                                FACILITY:  [facility]  PHYSICIAN:  Pieter Partridge, MD   DATE OF BIRTH:  Feb 05, 1991  DATE OF PROCEDURE:  02/04/2017 DATE OF DISCHARGE:                              OPERATIVE REPORT   PREOPERATIVE DIAGNOSIS:  Intrauterine pregnancy at 50 and 3/7th weeks, breech presentation.  POSTOPERATIVE DIAGNOSIS:  Intrauterine pregnancy at 39 and 3/7th weeks, breech presentation, single footling breech.  PROCEDURE:  Primary transverse cesarean section.  ASSISTANT:  First assist.  ANESTHESIA:  Local and spinal.  ESTIMATED BLOOD LOSS:  600.  URINE OUTPUT:  100 mL of clear urine throughout the procedure.  BLOOD ADMINISTERED:  None.  DRAINS:  Foley catheter.  Local lidocaine and Kenalog, 10 mL.  SPECIMEN:  None.  DISPOSITION:  The patient disposition to PACU, hemodynamically stable.  COMPLICATIONS:  None.  FINDINGS:  Viable female infant, single footling breech.  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  Normal tubes and ovaries bilaterally.  PROCEDURE IN DETAIL:  Sydney Dyer was identified in the holding area.  An ultrasound was performed confirming persistent breech presentation.  The patient previously had been counseled on risks, benefits, and alternatives of procedure.  She was then taken to the operating room, where she underwent spinal anesthesia without complication.  She was placed in the dorsolithotomy position.  Foley catheter was placed.  She was prepped in a normal sterile fashion.  A time-out was performed. SCDs were on her legs and operating.  Ancef 2 grams IV was administered prior to the procedure.  She was then draped and adequate anesthesia was confirmed.  The abdomen was marked.  A Pfannenstiel skin incision was made 2 cm above the symphysis pubis and carried down to the underlying layer of the fascia with the Bovie.  The fascia was incised at  the midline and extended laterally.  The fascia was grasped with a Kocher clamps and the rectus muscles were dissected off the fascia sharply.  The muscles were transected at the midline with the Bovie.  Stretched peritoneum was identified, tented up x2.  Metzenbaum scissors were then used to open the peritoneum after seeing the tip through the peritoneum.  Abdomen was stretched.  Uterus was palpated.  Alexis retractor was inserted.  The bladder flap appeared to be a little high, but that was made with the Russians and Metzenbaum scissors, and displaced cephalad.  The lower uterine segment was then incised in a transverse fashion with the scalpel and extended with the bandage scissors.  The left foot was seen. I then reached and brought that out.  Then I brought out the right foot sweeping that across at the knee and that was delivered out atraumatically.  Body was then brought out to the scapula and arms were delivered after rotating the body with a moistened blue towel.  Head came out very easily.  No nuchal cord noted.  The angles of the uterus were clamped and there was some bleeding noted. Delayed cord clamping was for minimal time less than 1 minute due to the bleeding of the mother.  Nose and mouth were suctioned, and baby was handed off to the awaiting NICU staff.  The baby had a very loud spontaneous cry.  The Pitocin was administered.  Fundal massage was performed.  The placenta was then removed and the uterus was cleared of all clots and debris.  There did feel like there was a horn on the right-hand side that was not matched by the left, but the right cornua seem to have a tunneling effect to it.  There were no fibroids visible on the anterior side, but the patient was noted to have a posterior fibroid which was not visualized.  The uterus was then cleared of all the blood was cleared of the peritoneum and the lower uterine segment was closed with 2 layers with 0 Vicryl  and a second layer was used for imbrication.  The gutters were cleared of all clots and debris.  Interceed was then applied to the hysterotomy incision and the Alexis retractor was removed.  The peritoneum was then grasped with the [facility] clamp x3.  The 2-0 Vicryl was then used to close the peritoneum.  Irrigation was performed.  The rectus muscles were inspected.  No bleeding was noted.  The fascia was then grasped with a Kocher clamp and closed with 0 Vicryl in a continuous running fashion.  Subcutaneous was closed with 2-0 plain gut. Skin was reapproximated with 4-0 Monocryl.  Kenalog and lidocaine were then mixed and injected into the subcu.  All instrument, sponge, and needle counts were correct x3.  The patient tolerated procedure well.  The baby remained in the room throughout the procedure.  The mother and baby were doing well.     Pieter PartridgeEvelyn B Sharada Albornoz, MD   ______________________________ Pieter PartridgeEvelyn B Gennavieve Huq, MD    EBV/MEDQ  D:  02/04/2017  T:  02/04/2017  Job:  [id number]

## 2017-02-04 NOTE — Brief Op Note (Signed)
02/04/2017  3:02 PM  PATIENT:  Sydney Dyer  26 y.o. female  PRE-OPERATIVE DIAGNOSIS:  IUP @ 39 2/7 weeks, BREECH PRESENTATION   POST-OPERATIVE DIAGNOSIS:  SINGLE FOOTLING BREECH   PROCEDURE:  Procedure(s): CESAREAN SECTION (N/A), Primary LTCS  SURGEON:  Surgeon(s) and Role:    * Geryl RankinsEvelyn Arvo Ealy, MD - Primary  PHYSICIAN ASSISTANT:   ASSISTANTS: 1st Assist   ANESTHESIA:   local and spinal  EBL:  Total I/O In: 2100 [I.V.:2100] Out: 700 [Urine:100; Blood:600]  BLOOD ADMINISTERED:none  DRAINS: Urinary Catheter (Foley)   LOCAL MEDICATIONS USED:  LIDOCAINE, Kenalog  and Amount: 10 ml  SPECIMEN:  No Specimen  DISPOSITION OF SPECIMEN:  PATHOLOGY  COUNTS:  YES  TOURNIQUET:  * No tourniquets in log *  DICTATION: .Other Dictation: Dictation Number [id number]  PLAN OF CARE: Admit to inpatient   PATIENT DISPOSITION:  PACU - hemodynamically stable.   Delay start of Pharmacological VTE agent (>24hrs) due to surgical blood loss or risk of bleeding: yes

## 2017-02-04 NOTE — Op Note (Deleted)
  The note originally documented on this encounter has been moved the the encounter in which it belongs.  

## 2017-02-04 NOTE — Interval H&P Note (Signed)
History and Physical Interval Note:  02/04/2017 1:26 PM  Sydney Dyer  has presented today for surgery, with the diagnosis of * No pre-op diagnosis entered *  The various methods of treatment have been discussed with the patient and family. After consideration of risks, benefits and other options for treatment, the patient has consented to  Procedure(s): CESAREAN SECTION (N/A) as a surgical intervention .  The patient's history has been reviewed, patient examined, no change in status, stable for surgery.  I have reviewed the patient's chart and labs.  Questions were answered to the patient's satisfaction.     Dion BodyVARNADO, Toshiyuki Fredell

## 2017-02-04 NOTE — Lactation Note (Signed)
This note was copied from a baby's chart. Lactation Consultation Note  [name] with baby at breast in PACU when [facility].  Infant has good rhythmic jaw movements and swallows heard with breast compression and massage. Baby in laid back nursing position.  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  Desires to bf and only questions/concerns she had was about preventing engorgement.  LC reviewed BF basics and supply and demand of BF.  Instructed mom to feed 8-12 times in 24 hours.  LC taught mom hand expression and gtts of colostrum noted.  Mom states she has leaked in the past weeks.  Reviewed what the mouth should look like during feeds and what it should feels like.  Wet diaper noted during feed.  [facility] brochure reviewed with mom and dad as well as resource sheet. Mom knows to call out with questions or assistance with feeds.    Patient Name: Sydney Dyer WUJWJ'XToday's Date: 02/04/2017 Reason for consult: Initial assessment   Maternal Data Formula Feeding for Exclusion: No Has patient been taught Hand Expression?: Yes Does the patient have breastfeeding experience prior to this delivery?: No  Feeding Feeding Type: Breast Milk Length of feed: 20 min  LATCH Score/Interventions Latch: Repeated attempts needed to sustain latch, nipple held in mouth throughout feeding, stimulation needed to elicit sucking reflex. Intervention(s): Adjust position;Breast compression  Audible Swallowing: Spontaneous and intermittent  Type of Nipple: Everted at rest and after stimulation  Comfort (Breast/Nipple): Soft / non-tender     Hold (Positioning): Assistance needed to correctly position infant at breast and maintain latch. Intervention(s): Breastfeeding basics reviewed;Support Pillows;Position options;Skin to skin  LATCH Score: 8  Lactation Tools Discussed/Used     Consult Status Consult Status: (S) Follow-up Date: 02/05/17 Follow-up type: In-patient    [name] [facility] 02/04/2017,  4:42 PM

## 2017-02-04 NOTE — H&P (View-Only) (Signed)
Sydney Dyer is a 26 y.o. female G1 @ 1638 3/7 weeks c/w 12 week ultrasound presenting for primary cesarean section due to Breech presentation.   PNC with [facility] (Dr. Dion BodyVarnado) since 10 weeks.  OB History    Gravida Para Term Preterm AB Living   1             SAB TAB Ectopic Multiple Live Births                 Past Medical History:  Diagnosis Date  . GERD (gastroesophageal reflux disease)   . Gestational diabetes   . Pneumonia    Past Surgical History:  Procedure Laterality Date  . NO PAST SURGERIES     Family History: family history includes Breast cancer in her maternal aunt; Diabetes in her brother; Hypertension in her father and mother. Social History:  reports that she has never smoked. She has never used smokeless tobacco. She reports that she drinks alcohol. She reports that she does not use drugs.     Maternal Diabetes: Yes:  Diabetes Type:  Diet controlled Genetic Screening: Normal Maternal Ultrasounds/Referrals: Normal Fetal Ultrasounds or other Referrals:  None Maternal Substance Abuse:  No Significant Maternal Medications:  Meds include: Other: Pepcid, Zofran, Diclegis Significant Maternal Lab Results:  Lab values include: Group B Strep negative Other Comments:  Persistent Breech  Review of Systems  Constitutional: Negative.   Gastrointestinal: Positive for abdominal pain.   Maternal Medical History:  Contractions: Frequency: irregular.   Perceived severity is moderate.    Fetal activity: Perceived fetal activity is normal.    Prenatal Complications - Diabetes: gestational. Diabetes is managed by diet.        Last menstrual period 03/16/2016. Maternal Exam:  Abdomen: Patient reports no abdominal tenderness. Estimated fetal weight is 7 lbs.   Fetal presentation: breech  Introitus: Normal vulva.   Fetal Exam Fetal Monitor Review: Mode: hand-held doppler probe.   Baseline rate: 140s.      Physical Exam  Constitutional: She is oriented to  person, place, and time. She appears well-developed and well-nourished. No distress.  HENT:  Head: Normocephalic and atraumatic.  Eyes: Conjunctivae and EOM are normal.  Neck: Normal range of motion.  Cardiovascular: Normal rate, regular rhythm and normal heart sounds.   Respiratory: Effort normal. No respiratory distress.  GI: There is no tenderness.  Musculoskeletal: Normal range of motion. She exhibits edema.  Neurological: She is alert and oriented to person, place, and time.  Skin: Skin is warm and dry.  Psychiatric: She has a normal mood and affect.    Prenatal labs: ABO, Rh: --/--/A POS, A POS (03/16 1015) Antibody: NEG (03/16 1015) Rubella: Immune (08/04 0000) RPR: Non Reactive (03/16 1015)  HBsAg: Negative (08/04 0000)  HIV: Non-reactive (08/04 0000)  GBS: Negative (02/28 0000)   Assessment/Plan: IUP @ 38 3/7  Weeks Breech presentation-Declined external version.  Desires cesarean section. Keloid  Proceed with Primary c-section.  Counseled on R/B/A. Kenalog w/ Lidocaine with closure.  Pt counseled on risk of hypopigmentation with Kenalog.  Pt desires to proceed.  Geryl RankinsVARNADO, Rudy Luhmann 02/03/2017, 10:36 PM

## 2017-02-05 LAB — CBC
HEMATOCRIT: 24.8 % — AB (ref 36.0–46.0)
Hemoglobin: 8.5 g/dL — ABNORMAL LOW (ref 12.0–15.0)
MCH: 27.5 pg (ref 26.0–34.0)
MCHC: 34.3 g/dL (ref 30.0–36.0)
MCV: 80.3 fL (ref 78.0–100.0)
Platelets: 202 10*3/uL (ref 150–400)
RBC: 3.09 MIL/uL — ABNORMAL LOW (ref 3.87–5.11)
RDW: 15.7 % — AB (ref 11.5–15.5)
WBC: 17.7 10*3/uL — AB (ref 4.0–10.5)

## 2017-02-05 LAB — TYPE AND SCREEN
ABO/RH(D): A POS
ANTIBODY SCREEN: NEGATIVE

## 2017-02-05 MED ORDER — POLYSACCHARIDE IRON COMPLEX 150 MG PO CAPS
150.0000 mg | ORAL_CAPSULE | Freq: Every day | ORAL | Status: DC
  Administered 2017-02-05 – 2017-02-06 (×2): 150 mg via ORAL
  Filled 2017-02-05 (×2): qty 1

## 2017-02-05 NOTE — Progress Notes (Signed)
Pressure dressing removed. Honeycomb was 50% saturated with dried bloody drainage. One steri strip was reinforced and all other steri strips were adhered and intact. Incision approximated and no bleeding noted. Honeycomb dressing replaced by sterile technique as ordered.

## 2017-02-05 NOTE — Progress Notes (Signed)
Subjective: Postop Day 1: Cesarean Delivery No complaints.  Pain controlled.  Lochia normal.  Breast feeding Yes.  Pt had soiling on honeycomb dressing overnight.  Ambulated in halls at 0200.  Objective: Temp:  [97.9 F (36.6 C)-99.2 F (37.3 C)] 98.1 F (36.7 C) (03/20 0845) Pulse Rate:  [77-111] 77 (03/20 0845) Resp:  [13-23] 16 (03/19 2100) BP: (104-123)/(57-86) 114/57 (03/20 0845) SpO2:  [97 %-100 %] 98 % (03/20 0400)  Physical Exam: Gen: NAD Abdomen:  Distended, Tympanic to percussion Lochia: Not visualized Uterine Fundus: firm, appropriately tender Incision: Honeycomb dressing is soiled ~ 65 %, Not marked. DVT Evaluation: + mild Edema present, no calf tenderness bilaterally    Recent Labs  02/05/17 0538  HGB 8.5*  HCT 24.8*    Assessment/Plan: Status post C-section-doing well postoperatively. Soiled dressing.  Change honeycomb.  Steri strips prn. Anemia.  Started iron this am. Encouraged ambulation. Continue routine post op care. Lactation support.    Sydney Dyer 02/05/2017, 1:40 PM

## 2017-02-06 LAB — CBC
HEMATOCRIT: 23.1 % — AB (ref 36.0–46.0)
HEMOGLOBIN: 7.7 g/dL — AB (ref 12.0–15.0)
MCH: 26.8 pg (ref 26.0–34.0)
MCHC: 33.3 g/dL (ref 30.0–36.0)
MCV: 80.5 fL (ref 78.0–100.0)
Platelets: 194 10*3/uL (ref 150–400)
RBC: 2.87 MIL/uL — ABNORMAL LOW (ref 3.87–5.11)
RDW: 16 % — AB (ref 11.5–15.5)
WBC: 14.7 10*3/uL — ABNORMAL HIGH (ref 4.0–10.5)

## 2017-02-06 MED ORDER — WITCH HAZEL-GLYCERIN EX PADS: 1.0000 "application " | MEDICATED_PAD | CUTANEOUS | Status: DC | PRN

## 2017-02-06 MED ORDER — ONDANSETRON HCL 4 MG PO TABS: 4.0000 mg | ORAL_TABLET | ORAL | Status: DC | PRN

## 2017-02-06 MED ORDER — ONDANSETRON HCL 4 MG/2ML IJ SOLN: 4.0000 mg | INTRAMUSCULAR | Status: DC | PRN

## 2017-02-06 MED ORDER — BISACODYL 10 MG RE SUPP
10.0000 mg | Freq: Every day | RECTAL | Status: DC | PRN
  Administered 2017-02-06: 10 mg via RECTAL
  Filled 2017-02-06: qty 1

## 2017-02-06 MED ORDER — BENZOCAINE-MENTHOL 20-0.5 % EX AERO: 1.0000 "application " | INHALATION_SPRAY | CUTANEOUS | Status: DC | PRN

## 2017-02-06 MED ORDER — IBUPROFEN 600 MG PO TABS: 600.0000 mg | ORAL_TABLET | Freq: Four times a day (QID) | ORAL | Status: DC

## 2017-02-06 MED ORDER — DIPHENHYDRAMINE HCL 25 MG PO CAPS: 25.0000 mg | ORAL_CAPSULE | Freq: Four times a day (QID) | ORAL | Status: DC | PRN

## 2017-02-06 MED ORDER — ACETAMINOPHEN 325 MG PO TABS: 650.0000 mg | ORAL_TABLET | ORAL | Status: DC | PRN

## 2017-02-06 MED ORDER — OXYCODONE-ACETAMINOPHEN 5-325 MG PO TABS: 1.0000 | ORAL_TABLET | ORAL | 0 refills | Status: AC | PRN

## 2017-02-06 MED ORDER — SIMETHICONE 80 MG PO CHEW: 80.0000 mg | CHEWABLE_TABLET | ORAL | Status: DC | PRN

## 2017-02-06 MED ORDER — TETANUS-DIPHTH-ACELL PERTUSSIS 5-2.5-18.5 LF-MCG/0.5 IM SUSP: 0.5000 mL | Freq: Once | INTRAMUSCULAR | Status: DC

## 2017-02-06 MED ORDER — SENNOSIDES-DOCUSATE SODIUM 8.6-50 MG PO TABS
2.0000 | ORAL_TABLET | ORAL | Status: DC
Start: 2017-02-07 — End: 2017-02-06

## 2017-02-06 MED ORDER — POLYSACCHARIDE IRON COMPLEX 150 MG PO CAPS: 150.0000 mg | ORAL_CAPSULE | Freq: Every day | ORAL | 1 refills | Status: AC

## 2017-02-06 MED ORDER — DIBUCAINE 1 % RE OINT: 1.0000 "application " | TOPICAL_OINTMENT | RECTAL | Status: DC | PRN

## 2017-02-06 MED ORDER — COCONUT OIL OIL: 1.0000 "application " | TOPICAL_OIL | Status: DC | PRN

## 2017-02-06 MED ORDER — PRENATAL MULTIVITAMIN CH: 1.0000 | ORAL_TABLET | Freq: Every day | ORAL | Status: DC

## 2017-02-06 MED ORDER — ZOLPIDEM TARTRATE 5 MG PO TABS: 5.0000 mg | ORAL_TABLET | Freq: Every evening | ORAL | Status: DC | PRN

## 2017-02-06 MED ORDER — IBUPROFEN 600 MG PO TABS: 600.0000 mg | ORAL_TABLET | Freq: Four times a day (QID) | ORAL | 0 refills | Status: AC

## 2017-02-06 NOTE — Plan of Care (Signed)
Problem: Activity: Goal: Ability to tolerate increased activity will improve Outcome: Completed/Met Date Met: 02/06/17 Patient ambulating well and encouraged to ambulate in the halls today.  Problem: Role Relationship: Goal: Ability to demonstrate positive interaction with newborn will improve Outcome: Completed/Met Date Met: 02/06/17 Pt demonstrating positive interaction with baby, and appropriate bonding.

## 2017-02-06 NOTE — Discharge Instructions (Signed)

## 2017-02-06 NOTE — Discharge Summary (Signed)
OB Discharge Summary     Patient Name: Sydney Dyer DOB: 04-15-1991 MRN: [id number]  Date of admission: 02/04/2017 Delivering MD: Geryl Rankins   Date of discharge: 02/06/2017  Admitting diagnosis: breech presentation Intrauterine pregnancy: [redacted]w[redacted]d     Secondary diagnosis:  Active Problems:   S/P cesarean section  Additional problems: None     Discharge diagnosis: Term Pregnancy Delivered                                                                                                Post partum procedures:NOne  Augmentation: n/a  Complications: None  Hospital course:  Sceduled C/S   26 y.o. yo G1P1001 at [redacted]w[redacted]d was admitted to the hospital 02/04/2017 for scheduled cesarean section with the following indication:Breech.  Membrane Rupture Time/Date: 2:03 PM ,02/04/2017   Patient delivered a Viable infant.02/04/2017  Details of operation can be found in separate operative note.  Pateint had an uncomplicated postpartum course.  She is ambulating, tolerating a regular diet, passing flatus, and urinating well. Patient is discharged home in stable condition on  02/06/17         Physical exam  Vitals:   02/05/17 0845 02/05/17 1230 02/05/17 1813 02/06/17 0520  BP: (!) 114/57 120/65 (!) 112/57 (!) 100/59  Pulse: 77 83 91 88  Resp:   18 18  Temp: 98.1 F (36.7 C) 98.5 F (36.9 C) 99 F (37.2 C) 98.6 F (37 C)  TempSrc: Oral Oral Oral Oral  SpO2: 96% 97%    Weight:      Height:       See note day of discharge  Labs: Lab Results  Component Value Date   WBC 14.7 (H) 02/06/2017   HGB 7.7 (L) 02/06/2017   HCT 23.1 (L) 02/06/2017   MCV 80.5 02/06/2017   PLT 194 02/06/2017   CMP Latest Ref Rng & Units 02/01/2017  Glucose 65 - 99 mg/dL 409(W)    Discharge instruction: per After Visit Summary and "Baby and Me Booklet".  After visit meds:  Allergies as of 02/06/2017   No Known Allergies     Medication List    TAKE these medications   acetaminophen 325 MG tablet Commonly  known as:  TYLENOL Take 325 mg by mouth every 6 (six) hours as needed for mild pain.   calcium carbonate 500 MG chewable tablet Commonly known as:  TUMS - dosed in mg elemental calcium Chew 1 tablet by mouth 3 (three) times daily as needed for indigestion or heartburn.   famotidine 10 MG tablet Commonly known as:  PEPCID Take 10 mg by mouth 2 (two) times daily.   ibuprofen 600 MG tablet Commonly known as:  ADVIL,MOTRIN Take 1 tablet (600 mg total) by mouth every 6 (six) hours.   iron polysaccharides 150 MG capsule Commonly known as:  NIFEREX Take 1 capsule (150 mg total) by mouth daily. Start taking on:  02/07/2017   NIFEdipine 10 MG capsule Commonly known as:  PROCARDIA Take 1 capsule (10 mg total) by mouth every 6 (six) hours as needed (Contractions).   oxyCODONE-acetaminophen 5-325 MG tablet Commonly  known as:  PERCOCET/ROXICET Take 1-2 tablets by mouth every 4 (four) hours as needed (pain scale 4-7).   prenatal vitamin w/FE, FA 27-1 MG Tabs tablet Take 1 tablet by mouth daily at 12 noon.   promethazine 25 MG tablet Commonly known as:  PHENERGAN Take 1 tablet (25 mg total) by mouth every 6 (six) hours as needed. What changed:  when to take this  reasons to take this       Diet: routine diet  Activity: Advance as tolerated. Pelvic rest for 6 weeks.   Outpatient follow up:2 weeks Follow up Appt:No future appointments. Follow up Visit:No Follow-up on file.  Postpartum contraception: Nexplanon  Newborn Data: Live born female  Birth Weight: 7 lb 2.6 oz (3250 g) APGAR: 8, 9  Baby Feeding: Breast Disposition:home with mother   02/06/2017 Geryl RankinsVARNADO, Fallynn Gravett, MD

## 2017-02-06 NOTE — Lactation Note (Signed)
This note was copied from a baby's chart. Lactation Consultation Note: Assist mother with latching infant on in cross cradle hold. Infant latched well with just 5 seconds of discomfort for mom. Mother has small nipple cracks bilaterally. Mother was given comfort gels. She was also advised to call OB and [facility]. Mother was advised to rotate positions frequently and to do good breast compression while feeding infant. Discussed cluster feeding, cue base feeding and growth spurts . Mother was advised in care of her breast during engorgement. She was given a harmony hand pump with instructions to use to prep nipple and areola for deeper latch. Review of S/S of Mastitis. Mother was advised to feed infant with feeding cues and at least 8-12 times in 24 hours. Encouraged frequent skin to skin. Mother is aware of available LC services and community support. Encouraged mother to call and schedule an outpatient visit with LC the first week of life. Mother bing a [employer] employee states she got her electric pump from store. She was advised keep accurate account of I/O for first 2 weeks of life. Mother was receptive to all teaching.   Patient Name: Sydney Dyer ZOXWR'UToday's Date: 02/06/2017 Reason for consult: Follow-up assessment   Maternal Data    Feeding Feeding Type: Breast Fed  LATCH Score/Interventions Latch: Grasps breast easily, tongue down, lips flanged, rhythmical sucking.  Audible Swallowing: Spontaneous and intermittent  Type of Nipple: Everted at rest and after stimulation  Comfort (Breast/Nipple): Filling, red/small blisters or bruises, mild/mod discomfort     Hold (Positioning): Assistance needed to correctly position infant at breast and maintain latch.  LATCH Score: 8  Lactation Tools Discussed/Used     Consult Status Consult Status: Complete    Michel BickersKendrick, [name] 02/06/2017, 10:28 AM

## 2017-02-06 NOTE — Progress Notes (Signed)
Subjective: Postop Day 2: Cesarean Delivery No complaints.  Pain controlled.  Lochia normal.  Breast feeding Yes.  Pt had soiling on honeycomb dressing overnight.  Ambulated in halls at 0200.  Objective: Temp:  [98.6 F (37 C)-99 F (37.2 C)] 98.6 F (37 C) (03/21 0520) Pulse Rate:  [88-91] 88 (03/21 0520) Resp:  [18] 18 (03/21 0520) BP: (100-112)/(57-59) 100/59 (03/21 0520)  Physical Exam: Gen: NAD Abdomen:  Distended, Tympanic to percussion, unchanged from yesterday, nontender Lochia: Not visualized Uterine Fundus: firm, appropriately tender Incision: Honeycomb dressing is clean, minimal blood DVT Evaluation: + mild Edema present, no calf tenderness bilaterally    Recent Labs  02/05/17 0538 02/06/17 0531  HGB 8.5* 7.7*  HCT 24.8* 23.1*    Assessment/Plan: Status post C-section-doing well postoperatively. Dressing clean after being replaced yesterday. Anemia.  Continue once daily.  No signs of active bleeding. Encouraged ambulation. Dulcolax suppository for constipation. Discharge home late afternoon.    Geryl RankinsVARNADO, Misael Mcgaha 02/06/2017, 1:49 PM

## 2017-04-02 DIAGNOSIS — Z3202 Encounter for pregnancy test, result negative: Secondary | ICD-10-CM | POA: Diagnosis not present

## 2017-04-02 DIAGNOSIS — Z30017 Encounter for initial prescription of implantable subdermal contraceptive: Secondary | ICD-10-CM | POA: Diagnosis not present

## 2017-05-13 DIAGNOSIS — Z3046 Encounter for surveillance of implantable subdermal contraceptive: Secondary | ICD-10-CM | POA: Diagnosis not present

## 2017-05-13 DIAGNOSIS — F4323 Adjustment disorder with mixed anxiety and depressed mood: Secondary | ICD-10-CM | POA: Diagnosis not present

## 2018-02-23 IMAGING — US US MFM FETAL BPP W/O NON-STRESS
1 series · 12 of 12 positions shown · non-contrast
Comparison: none

[Series 1: us mfm fetal bpp w/o non-stress · 12 acquisitions, 12 frames shown]
[im 1/12]
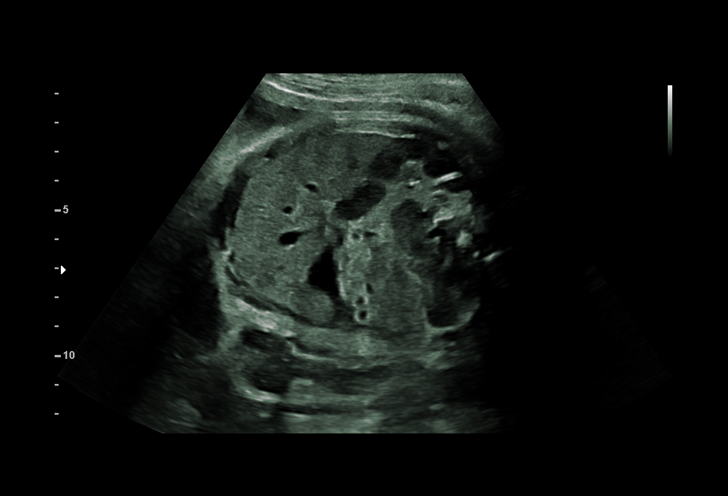
[im 2/12]
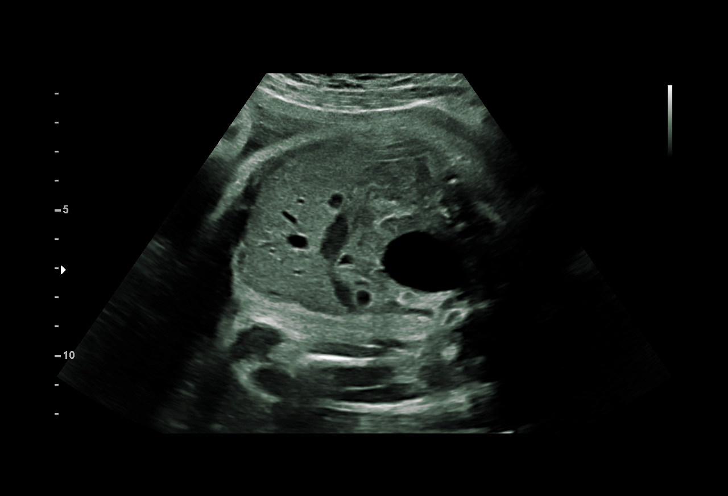
[im 3/12]
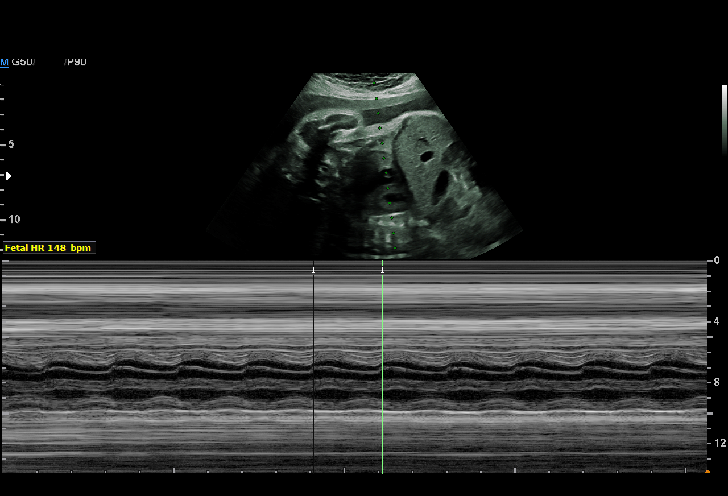
[im 4/12]
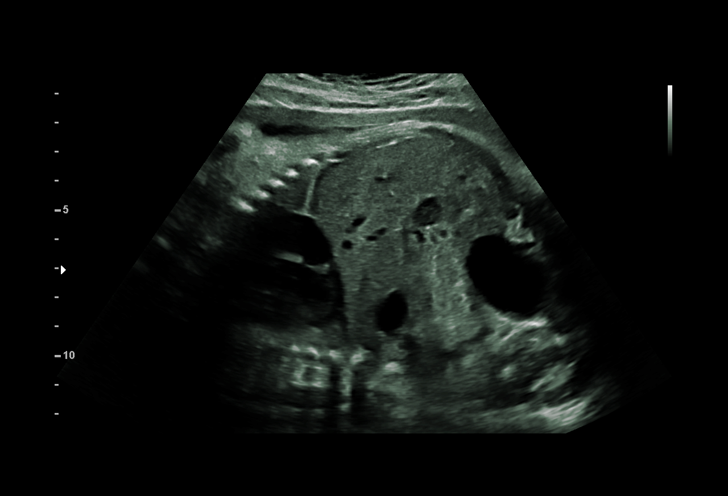
[im 5/12]
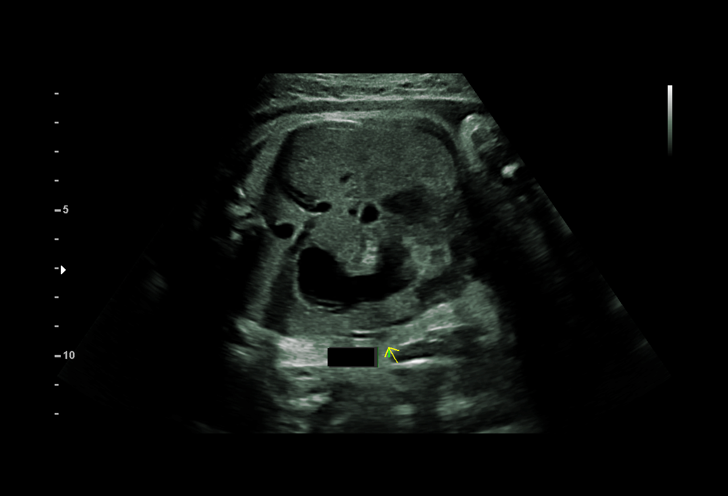
[im 6/12]
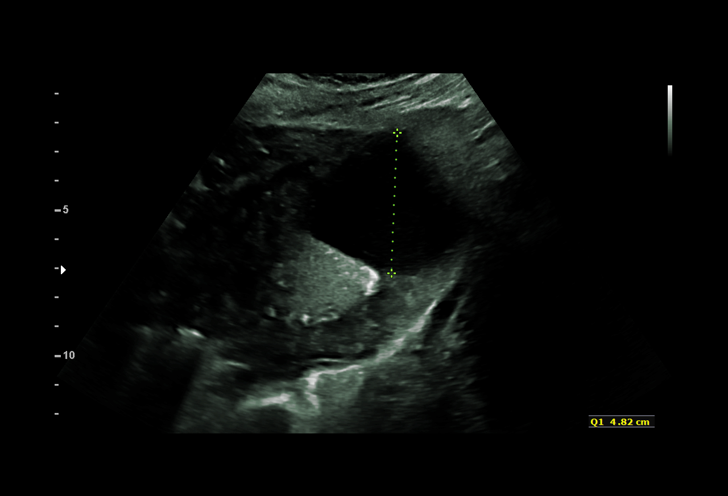
[im 7/12]
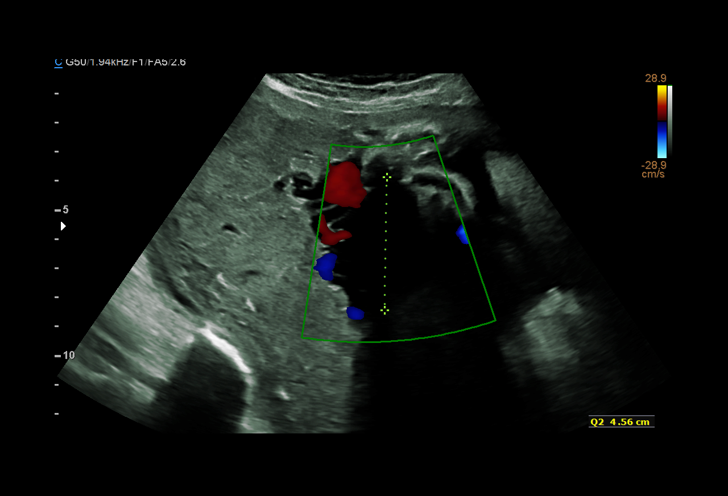
[im 8/12]
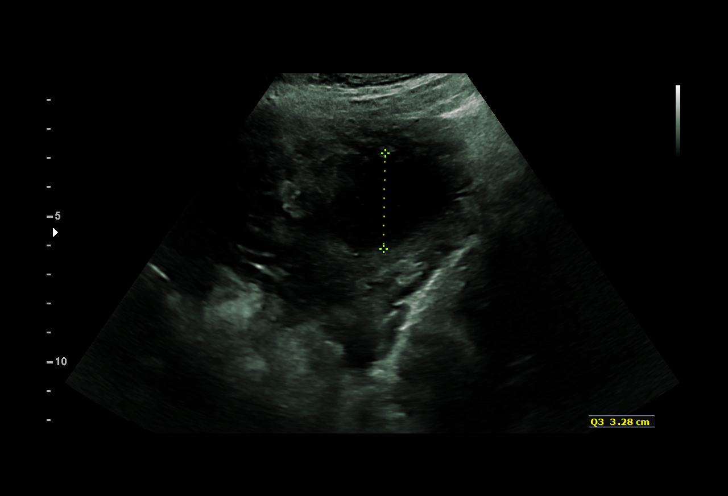
[im 9/12]
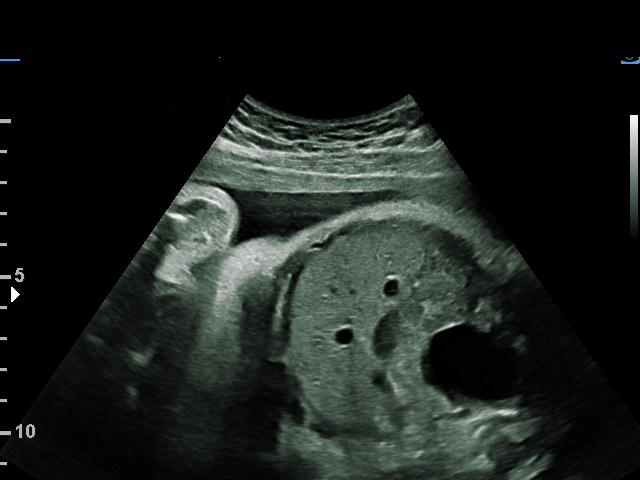
[im 10/12]
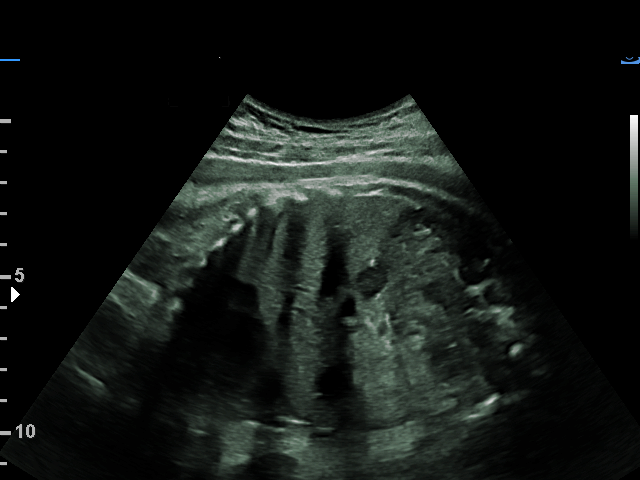
[im 11/12]
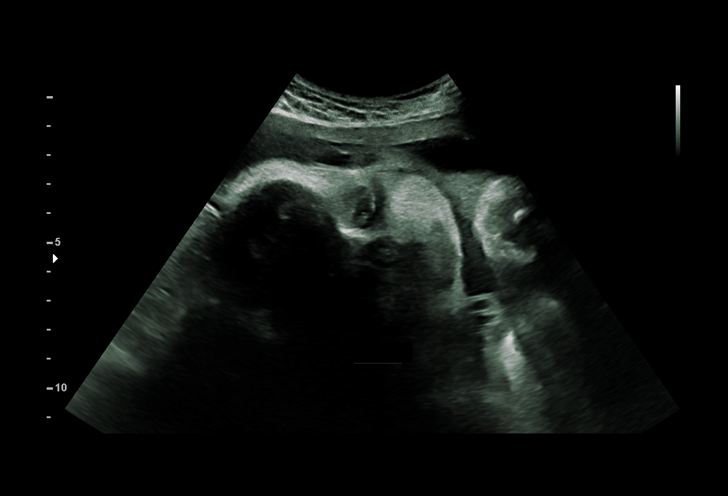
[im 12/12]
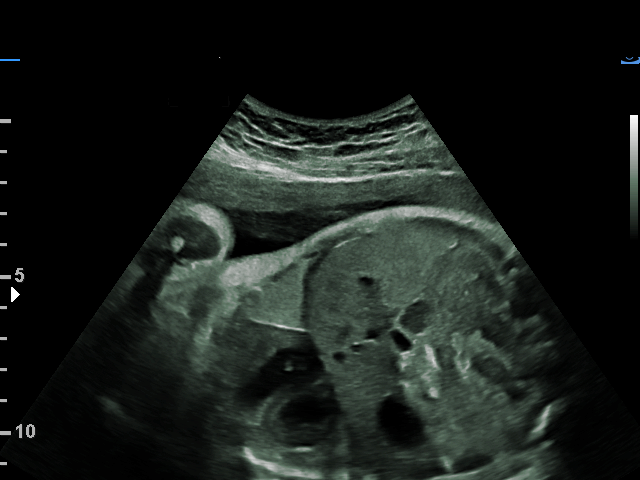

[12 of 12 positions shown; findings below may reference images not displayed]

MAU/Triage

1  MSRCELO KUBIAK           [id number]      [id number]     [id number]
Indications

33 weeks gestation of pregnancy
Non-reactive NST, FHR decelerations
Preterm contractions
OB History

Gravidity:    1
Fetal Evaluation

Num Of Fetuses:     1
Fetal Heart         148
Rate(bpm):
Cardiac Activity:   Observed
Presentation:       Breech

Amniotic Fluid
AFI FV:      Subjectively within normal limits

AFI Sum(cm)     %Tile
12.6            38
Biophysical Evaluation

Amniotic F.V:   Within normal limits       F. Tone:        Observed
F. Movement:    Observed                   Score:          [DATE]
F. Breathing:   Observed
Gestational Age
Clinical EDD:  33w 3d                                        EDD:   02/09/17
Best:          33w 3d    Det. By:   Clinical EDD             EDD:   02/09/17
Anatomy

Stomach:               Appears normal, left   Bladder:                Appears normal
sided
Cervix Uterus Adnexa

Cervix
Not visualized (advanced GA >95wks)
Impression

SIUP at 33+3 weeks
Breech presentation
Normal amniotic fluid volume
BPP [DATE]
Recommendations

Follow-up as clinically indicated
